# Patient Record
Sex: Female | Born: 1970 | Race: White | Hispanic: No | Marital: Married | Smoking: Former smoker
Health system: Southern US, Community
[De-identification: ages and names within clinical notes are randomized; demographics above are authoritative.]

## PROBLEM LIST (undated history)

## (undated) DIAGNOSIS — M797 Fibromyalgia: Secondary | ICD-10-CM

## (undated) DIAGNOSIS — F32A Depression, unspecified: Secondary | ICD-10-CM

## (undated) DIAGNOSIS — G8929 Other chronic pain: Secondary | ICD-10-CM

## (undated) DIAGNOSIS — F419 Anxiety disorder, unspecified: Secondary | ICD-10-CM

## (undated) DIAGNOSIS — M549 Dorsalgia, unspecified: Secondary | ICD-10-CM

## (undated) DIAGNOSIS — F329 Major depressive disorder, single episode, unspecified: Secondary | ICD-10-CM

## (undated) DIAGNOSIS — E785 Hyperlipidemia, unspecified: Secondary | ICD-10-CM

## (undated) HISTORY — PX: OTHER SURGICAL HISTORY: SHX169

## (undated) HISTORY — PX: CHOLECYSTECTOMY: SHX55

## (undated) HISTORY — PX: GASTROPLASTY DUODENAL SWITCH: SHX1699

## (undated) HISTORY — DX: Dorsalgia, unspecified: M54.9

## (undated) HISTORY — DX: Other chronic pain: G89.29

## (undated) HISTORY — DX: Major depressive disorder, single episode, unspecified: F32.9

## (undated) HISTORY — DX: Fibromyalgia: M79.7

## (undated) HISTORY — DX: Anxiety disorder, unspecified: F41.9

## (undated) HISTORY — DX: Depression, unspecified: F32.A

---

## 2006-08-03 ENCOUNTER — Encounter: Payer: Self-pay | Admitting: Unknown Physician Specialty

## 2006-08-22 ENCOUNTER — Encounter: Payer: Self-pay | Admitting: Unknown Physician Specialty

## 2007-05-13 ENCOUNTER — Ambulatory Visit: Payer: Self-pay | Admitting: Anesthesiology

## 2007-05-27 ENCOUNTER — Ambulatory Visit: Payer: Self-pay | Admitting: Anesthesiology

## 2007-06-16 ENCOUNTER — Ambulatory Visit: Payer: Self-pay | Admitting: Anesthesiology

## 2007-07-28 ENCOUNTER — Ambulatory Visit: Payer: Self-pay | Admitting: Anesthesiology

## 2010-09-03 ENCOUNTER — Ambulatory Visit: Payer: Self-pay | Admitting: Emergency Medicine

## 2010-09-22 HISTORY — PX: BREAST BIOPSY: SHX20

## 2011-07-07 ENCOUNTER — Ambulatory Visit: Payer: Self-pay | Admitting: General Surgery

## 2012-09-17 ENCOUNTER — Ambulatory Visit (HOSPITAL_COMMUNITY)
Admission: RE | Admit: 2012-09-17 | Discharge: 2012-09-17 | Disposition: A | Discharge status: Home / Self Care | Payer: 59 | Attending: Psychiatry | Admitting: Psychiatry

## 2012-09-17 ENCOUNTER — Encounter (HOSPITAL_COMMUNITY): Payer: Self-pay | Admitting: *Deleted

## 2012-09-17 DIAGNOSIS — F411 Generalized anxiety disorder: Secondary | ICD-10-CM | POA: Insufficient documentation

## 2012-09-17 DIAGNOSIS — F332 Major depressive disorder, recurrent severe without psychotic features: Secondary | ICD-10-CM | POA: Insufficient documentation

## 2012-09-17 HISTORY — DX: Hyperlipidemia, unspecified: E78.5

## 2012-09-17 NOTE — BH Assessment (Signed)
Assessment Note   Madison Morgan is an 41 y.o. female. Referred for an assessment for psych IOP. This service was recommended by her psychiatrist Dr. Imogene Burn, he stated she needed something more intense and frequent then he can currently provide. She saw him yesterday and he did make several changes to her medication regimen. She reports a long history of anxiety and panic going back to when she was [age]. As a result she states of this long history of anxiety with very little relief or treatment she is exhausted and tired of fighting and feels that these feelings have resulted in her being depressed and at times having suicidal thoughts. She has not had any previous attempts. She has not had any inpatient psychiatric admissions. As recently as three weeks ago she felt especially defeated and considered taking the six Klonopin she had in her possession before she went to bed hoping she would not wake up. She did not complete because she researched on the internet its toxicity and she found six would not kill her so she abandoned the plan. She denies being suicidal today.She is accompanied by her husband of 14 years and he is present for the assessment and supportive of her.She states she has anxiety and panic daily and it has worsened slightly since Aug when she started a new job.She becomes more anxious when she leaves the home and because of this she rarely does. She has a lot of anxiety around driving and going to the grocery store, the dread of it an anxiety attack happening is a lot of what will start an attack.States twenty years ago when her Mother left her Father he had a nervous breakdown, no other known family history.She is tearful and has a sad affect in interview. She is taking time off from work to address her mood and anxiety and to be able to attend IOP. She denies any substance abuse issues, denies any psychosis, and is not homicidal. She is seeking medication management and relief form such  frequent and intense anxiety and depression. She has a start date for psych IOP on Monday the 30th. She was directed downstairs today after completing the assessment to get her new person paper work to complete and bring back with her on Monday.She is currently taking Cymbalta and this was started two weeks ago. Her Klonopin regimen was changed from 1 mg at HS to one half a pill BID and her first day with this regimen was today. She states strongly that she is not bipolar but is prescribed Lithium and Seroquel. She states the medications are not having much effect. She has had a trial of Nortriptyline in the past ans she couldn't tolerate it she said it made her feel very angry. She has a start date for psych IOP on Monday the 30th.  Axis I: Anxiety Disorder NOS and Major Depression, Recurrent severe Axis II: No diagnosis Axis III:  Past Medical History  Diagnosis Date  . Hyperlipemia     currently taking lipitor   Axis IV: other psychosocial or environmental problems and problems related to social environment Axis V: 41-50 serious symptoms  Past Medical History:  Past Medical History  Diagnosis Date  . Hyperlipemia     currently taking lipitor    No past surgical history on file.  Family History: No family history on file.  Social History:  reports that she has been smoking.  She has never used smokeless tobacco. She reports that she does not drink alcohol or  use illicit drugs.  Additional Social History:  Alcohol / Drug Use Pain Medications: not abusing Prescriptions: not abusing Over the Counter: not abusing History of alcohol / drug use?: No history of alcohol / drug abuse  CIWA:   COWS:    Allergies: Allergies no known allergies  Home Medications:  (Not in a hospital admission)  OB/GYN Status:  No LMP recorded.  General Assessment Data Location of Assessment: [facility] Living Arrangements: Spouse/significant other Can pt return to current living  arrangement?: Yes Admission Status: Voluntary Is patient capable of signing voluntary admission?: Yes Transfer from: Home Referral Source: Psychiatrist  Education Status Is patient currently in school?: No  Risk to self Suicidal Ideation: No Suicidal Intent: No Is patient at risk for suicide?: No Suicidal Plan?: No Access to Means: No What has been your use of drugs/alcohol within the last 12 months?: none Previous Attempts/Gestures: No How many times?: 0  Other Self Harm Risks: none Triggers for Past Attempts:  (none) Intentional Self Injurious Behavior: None Family Suicide History: No Recent stressful life event(s):  (job change in Aug of 2013) Persecutory voices/beliefs?: No Depression: Yes Depression Symptoms: Despondent;Tearfulness;Isolating;Fatigue;Guilt;Loss of interest in usual pleasures;Feeling worthless/self pity Substance abuse history and/or treatment for substance abuse?: No Suicide prevention information given to non-admitted patients: Not applicable  Risk to Others Homicidal Ideation: No Thoughts of Harm to Others: No Current Homicidal Intent: No Current Homicidal Plan: No Access to Homicidal Means: No History of harm to others?: No Assessment of Violence: None Noted Does patient have access to weapons?: No Criminal Charges Pending?: No Does patient have a court date: No  Psychosis Hallucinations: None noted Delusions: None noted  Mental Status Report Appear/Hygiene:  (attractive , neat ) Eye Contact: Good Motor Activity: Freedom of movement;Unremarkable Speech: Logical/coherent Level of Consciousness: Alert Mood: Depressed Affect: Sad Anxiety Level: Moderate Thought Processes: Coherent;Relevant Judgement: Unimpaired Orientation: Person;Place;Time;Situation Obsessive Compulsive Thoughts/Behaviors: None  Cognitive Functioning Concentration: Normal Memory: Recent Intact;Remote Intact IQ: Average Insight: Good Impulse Control:  Good Appetite: Poor Weight Loss: 10  Weight Gain: 0  Sleep: No Change Total Hours of Sleep: 9  Vegetative Symptoms: None  ADLScreening [facility]) Patient's cognitive ability adequate to safely complete daily activities?: Yes Patient able to express need for assistance with ADLs?: Yes Independently performs ADLs?: Yes (appropriate for developmental age)  Abuse/Neglect [facility]) Physical Abuse: Denies Verbal Abuse: Denies Sexual Abuse: Denies  Prior Inpatient Therapy Prior Inpatient Therapy: No  Prior Outpatient Therapy Prior Outpatient Therapy: Yes Prior Therapy Dates: current Prior Therapy Facilty/Provider(s): Dr Imogene Burn Reason for Treatment: depression and anxiety  ADL Screening (condition at time of admission) Patient's cognitive ability adequate to safely complete daily activities?: Yes Patient able to express need for assistance with ADLs?: Yes Independently performs ADLs?: Yes (appropriate for developmental age) Weakness of Legs: None Weakness of Arms/Hands: None  Home Assistive Devices/Equipment Home Assistive Devices/Equipment: None    Abuse/Neglect Assessment (Assessment to be complete while patient is alone) Physical Abuse: Denies Verbal Abuse: Denies Sexual Abuse: Denies Exploitation of patient/patient's resources: Denies Self-Neglect: Denies Values / Beliefs Cultural Requests During Hospitalization: None Spiritual Requests During Hospitalization: None   Advance Directives (For Healthcare) Advance Directive: Patient would not like information Pre-existing out of facility DNR order (yellow form or pink MOST form): No Nutrition Screen- [facility] Adult/WL/AP Patient's home diet: Regular Have you recently lost weight without trying?: Yes If yes, how much weight have you lost?: 2-13 lb Have you been eating poorly because of a decreased appetite?:  Yes Malnutrition Screening Tool Score: 2   Additional Information 1:1 In Past 12 Months?: No CIRT Risk:  No Elopement Risk: No Does patient have medical clearance?: No     Disposition:  Disposition Disposition of Patient: Outpatient treatment Type of outpatient treatment: Psych Intensive Outpatient  On Site Evaluation by:   Reviewed with Physician:     Wynona Luna 09/17/2012 12:25 PM

## 2012-09-20 ENCOUNTER — Encounter (HOSPITAL_COMMUNITY): Payer: Self-pay

## 2012-09-20 ENCOUNTER — Other Ambulatory Visit (HOSPITAL_COMMUNITY): Payer: 59 | Attending: Psychiatry | Admitting: Psychiatry

## 2012-09-20 DIAGNOSIS — F329 Major depressive disorder, single episode, unspecified: Secondary | ICD-10-CM | POA: Insufficient documentation

## 2012-09-20 DIAGNOSIS — F411 Generalized anxiety disorder: Secondary | ICD-10-CM | POA: Insufficient documentation

## 2012-09-20 DIAGNOSIS — F332 Major depressive disorder, recurrent severe without psychotic features: Secondary | ICD-10-CM | POA: Insufficient documentation

## 2012-09-20 DIAGNOSIS — F32A Depression, unspecified: Secondary | ICD-10-CM | POA: Insufficient documentation

## 2012-09-20 DIAGNOSIS — F410 Panic disorder [episodic paroxysmal anxiety] without agoraphobia: Secondary | ICD-10-CM | POA: Insufficient documentation

## 2012-09-20 DIAGNOSIS — F331 Major depressive disorder, recurrent, moderate: Secondary | ICD-10-CM

## 2012-09-20 MED ORDER — DULOXETINE HCL 30 MG PO CPEP: 30.0000 mg | ORAL_CAPSULE | Freq: Two times a day (BID) | ORAL | Status: DC
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
## 2012-09-20 NOTE — Progress Notes (Signed)
    Daily Group Progress Note  Program: IOP  Group Time: 9:00-10:30 am   Participation Level: Active  Behavioral Response: Appropriate  Type of Therapy:  Process Group  Summary of Progress: Patient was new to the group today and observed the group process.      Group Time: 10:30 am - 12:00 pm   Participation Level:  Active  Behavioral Response: Appropriate  Type of Therapy: Psycho-education Group  Summary of Progress:  Patient participated in a goodbye ceremony to a member ending today and practiced the skill of healthy closure.  Carman Ching, LCSW

## 2012-09-21 ENCOUNTER — Other Ambulatory Visit (HOSPITAL_COMMUNITY): Payer: 59 | Admitting: Psychiatry

## 2012-09-21 DIAGNOSIS — F32A Depression, unspecified: Secondary | ICD-10-CM

## 2012-09-21 DIAGNOSIS — F329 Major depressive disorder, single episode, unspecified: Secondary | ICD-10-CM
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
## 2012-09-23 ENCOUNTER — Other Ambulatory Visit (HOSPITAL_COMMUNITY): Payer: 59 | Attending: Psychiatry | Admitting: Psychiatry

## 2012-09-23 DIAGNOSIS — F410 Panic disorder [episodic paroxysmal anxiety] without agoraphobia: Secondary | ICD-10-CM | POA: Insufficient documentation

## 2012-09-23 DIAGNOSIS — F32A Depression, unspecified: Secondary | ICD-10-CM

## 2012-09-23 DIAGNOSIS — F411 Generalized anxiety disorder: Secondary | ICD-10-CM | POA: Diagnosis not present

## 2012-09-23 DIAGNOSIS — F332 Major depressive disorder, recurrent severe without psychotic features: Secondary | ICD-10-CM | POA: Insufficient documentation

## 2012-09-23 DIAGNOSIS — F329 Major depressive disorder, single episode, unspecified: Secondary | ICD-10-CM

## 2012-09-23 NOTE — Progress Notes (Signed)
Patient ID: Madison Morgan, female   DOB: 06/24/1971, 42 y.o.   MRN: [id number] Patient seen today.  She is concerned about feeling tired and very sleepy in the morning.  She's taking Seroquel 200 mg at bedtime, lithium 450 mg twice a day, Cymbalta 30 mg twice a day and Klonopin 1 mg twice a day.  Seroquel and lithium was started by her psychiatrist recently.  She endorse feeling very sleepy soon after taking the Seroquel.  I recommend to take Seroquel 100 mg for first few days to avoid sedation and excessive tiredness and then gradually increase to 200 milligram.  Patient acknowledged.  I explained risks and benefits of medication.  Reassurance given.

## 2012-09-23 NOTE — Progress Notes (Signed)
    Daily Group Progress Note  Program: IOP  Group Time: 0900-1030  Participation Level: Minimal  Behavioral Response: Drowsy  Type of Therapy:  Process Group  Summary of Progress: patient states she's feeling very depressed today.  Concerned that her medication isn't working.  Requested to see the doctor.  Pt discussed how the conflict between her husband and oldest daughter is affecting her.     Group Time: 1045-1200  Participation Level:  Minimal  Behavioral Response: Motivated  Type of Therapy: Psycho-education Group Discussed sleep hygiene practices:   What it is, five stages of sleep, tips for a better sleep, and which tip he will implement.     Chestine Spore, RITA, CNA

## 2012-09-24 ENCOUNTER — Other Ambulatory Visit (HOSPITAL_COMMUNITY): Payer: 59 | Admitting: Psychiatry

## 2012-09-24 DIAGNOSIS — F329 Major depressive disorder, single episode, unspecified: Secondary | ICD-10-CM

## 2012-09-24 DIAGNOSIS — F32A Depression, unspecified: Secondary | ICD-10-CM

## 2012-09-24 DIAGNOSIS — F332 Major depressive disorder, recurrent severe without psychotic features: Secondary | ICD-10-CM | POA: Diagnosis not present

## 2012-09-24 NOTE — Progress Notes (Signed)
Patient ID: Madison Morgan, female   DOB: 1971-08-29, 42 y.o.   MRN: [id number] Today this patient was interviewed for 45 minutes. She was asked to be seen because she remains persistently depressed. Her mood is no different than it's been over the last few days. And a quick summary the patient has been feeling persistently depressed for the last 6 weeks which is something she's never experienced before. She's never been persistently depressed rather she's had weeks of being depressed. Recently her psychiatrist in [place] her back on the Cymbalta 60 mg a day, Eskalith 450 twice a day and recently her Seroquel dose was lower from 200 mg to 150 mg.the reason for the change was because the patient felt overly sedated and now feels a bit better. The main issues with this patient seem to be related to her work setting which recently denied for short term disability.This denial is probably more related to paperwork issues and she is now in the process of resolving these problems. He is also related to conflicts at home between her daughter and her husband. She claims much of this is just related to financial issues. Today the patient denies being acutely suicidal. She's never made an attempt in the past. She has a vague plan but has good reasons not to make an attempt to herself. She been on these medications only for approximately a week. Today the patient was educated that it takes 3-4 weeks to have any true affect. The patient denies any psychotic symptomatology. The patient's sleep pattern is that she's still sleeping excessively. It is noted that she said that she had a lithium blood level that was 0.8 recently.  Today's recommendations are for the patient that she continue taking all these medications as prescribed. She her self has discontinued her Klonopin. I do not believe she is acutely suicidal at this time. In addition to the recommendations of continuing on with the IOP program is to make contact with  the previous psychotherapist that she had seen a year ago in the [place] community. I recommendations was that she should go ahead and plan appointment in approximately one to 2 weeks when she's out of the IOP program. Another recommendation is to consider having a family session while she is in the IOP program. The patient agreed with these recommendations. At this time will make no changes in her medications. Once again she was described the pros and cons of her medications potential side effects and she agreed to continue these medicines.

## 2012-09-24 NOTE — Progress Notes (Signed)
    Daily Group Progress Note  Program: IOP  Group Time: 0900-1100  Participation Level: Active  Behavioral Response: Sharing  Type of Therapy:  Process Group  Summary of Progress: Patient voiced her frustration with her depression and the medication.  States she did make the medication adjustments that Dr. Lolly Mustache recommended yesterday.  Pt became very animated when another peer was talking about football.  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 Patient also practiced meditation and progressive relaxation in relation to the psycho-educational group yesterday.     Group Time: ----  Participation Level:  Active  Behavioral Response: Appropriate  Type of Therapy: Psycho-education Group  Summary of Progress: cc: above  Madison Morgan, RITA, CNA

## 2012-09-27 ENCOUNTER — Other Ambulatory Visit (HOSPITAL_COMMUNITY): Payer: 59 | Admitting: Psychiatry

## 2012-09-27 DIAGNOSIS — F332 Major depressive disorder, recurrent severe without psychotic features: Secondary | ICD-10-CM | POA: Diagnosis not present

## 2012-09-27 DIAGNOSIS — F32A Depression, unspecified: Secondary | ICD-10-CM

## 2012-09-27 DIAGNOSIS — F329 Major depressive disorder, single episode, unspecified: Secondary | ICD-10-CM

## 2012-09-27 NOTE — Progress Notes (Signed)
Patient ID: Madison Morgan, female   DOB: 16-Oct-1970, 42 y.o.   MRN: [id number] A:  Provided pt with Vocational Rehabilitation handout.  R:  Pt receptive.

## 2012-09-27 NOTE — Progress Notes (Signed)
    Daily Group Progress Note  Program: IOP  Group Time: 9:00-10:30 am   Participation Level: Active  Behavioral Response: Appropriate  Type of Therapy:  Process Group  Summary of Progress: Patient reports high anxiety today associated with financial difficulties from being off from her job and not being approved yet for short term disability. She talked about how she feels like a "burden" to her family for needing to ask her parents for money and for her husband paying bills she is responsible for. She states this stressors is preventing her from focusing on the main reasons for entering treatment. She also talked about how her job is a trigger for stress and depression and although she is looking for alternate employment, she has not been able to find anything.      Group Time: 10:30 am - 12:00 pm   Participation Level:  Active  Behavioral Response: Appropriate  Type of Therapy: Psycho-education Group  Summary of Progress: Patient participated in a group on Grief and Loss that was facilitated by Theda Belfast and identified current losses and affective ways of grieving.   Carman Ching, LCSW

## 2012-09-28 ENCOUNTER — Other Ambulatory Visit (HOSPITAL_COMMUNITY): Payer: 59 | Admitting: Psychiatry

## 2012-09-28 DIAGNOSIS — F32A Depression, unspecified: Secondary | ICD-10-CM

## 2012-09-28 DIAGNOSIS — F329 Major depressive disorder, single episode, unspecified: Secondary | ICD-10-CM

## 2012-09-28 DIAGNOSIS — F332 Major depressive disorder, recurrent severe without psychotic features: Secondary | ICD-10-CM | POA: Diagnosis not present

## 2012-09-28 NOTE — Progress Notes (Signed)
    Daily Group Progress Note  Program: IOP  Group Time: 9:00-10:30 am   Participation Level: Active  Behavioral Response: Appropriate  Type of Therapy:  Process Group  Summary of Progress: Patient reports high anxiety and depression associated with financial struggles and states she did not have the courage to ask her parents for money yesterday to help her pay bills that are due. She feels like a burden to her family. She states she feels slightly improved with her depression by having a place to come and talk with others and expressed fears about what she will do to manage her depression after the group is over. She said she feels accepted and safe here.      Group Time: 10:30 am - 12:00 pm   Participation Level:  Active  Behavioral Response: Appropriate  Type of Therapy: Psycho-education Group  Summary of Progress: Patient learned about the symptoms of depression and anxiety and how to recognize them to avoid or reduce future relapses.  Carman Ching, LCSW

## 2012-09-29 ENCOUNTER — Other Ambulatory Visit (HOSPITAL_COMMUNITY): Payer: 59 | Admitting: Psychiatry

## 2012-09-29 DIAGNOSIS — F329 Major depressive disorder, single episode, unspecified: Secondary | ICD-10-CM

## 2012-09-29 DIAGNOSIS — F332 Major depressive disorder, recurrent severe without psychotic features: Secondary | ICD-10-CM | POA: Diagnosis not present

## 2012-09-29 DIAGNOSIS — F32A Depression, unspecified: Secondary | ICD-10-CM

## 2012-09-29 NOTE — Progress Notes (Signed)
    Daily Group Progress Note  Program: IOP  Group Time: 9:00-10:30 am   Participation Level: Active  Behavioral Response: Appropriate  Type of Therapy:  Process Group  Summary of Progress: Patient reports no change in her depression and anxiety and if anything feels them worsening each day she puts off asking for financial help from her parents to help pay past due bills. Members challenged patient on how she is contributing to her depression by not addressing stressors in her life. Patient shared how she struggles to ask for or accept help from others and tries to make sure others needs are taken care of before hers. She is unsure if she wants to change this behavior but explored how it is contributing to her depression.       Group Time: 10:30 am - 12:00 pm   Participation Level:  Active  Behavioral Response: Appropriate  Type of Therapy: Psycho-education Group  Summary of Progress: Patient learned about mental health support groups for continuing support following ending the program.   Carman Ching, LCSW

## 2012-09-30 ENCOUNTER — Other Ambulatory Visit (HOSPITAL_COMMUNITY): Payer: 59 | Admitting: Psychiatry

## 2012-09-30 DIAGNOSIS — F332 Major depressive disorder, recurrent severe without psychotic features: Secondary | ICD-10-CM | POA: Diagnosis not present

## 2012-09-30 DIAGNOSIS — F32A Depression, unspecified: Secondary | ICD-10-CM

## 2012-09-30 DIAGNOSIS — F329 Major depressive disorder, single episode, unspecified: Secondary | ICD-10-CM

## 2012-09-30 NOTE — Progress Notes (Signed)
    Daily Group Progress Note  Program: IOP  Group Time: 9:00-10:30 am   Participation Level: Minimal  Behavioral Response: Appropriate  Type of Therapy:  Group Therapy  Summary of Progress: Patient reports high depression again today and appears to not be making much progress. She has so much guilt and shame about herself that she struggles to ask others for the help that she needs. She is working on improving her image of self and increasing her self-esteem that is feeding her depression.       Group Time: 10:30 am - 12:00 pm   Participation Level:  Active  Behavioral Response: Appropriate  Type of Therapy: Psycho-education Group  Summary of Progress: Patient participated in two goodbye ceremonies to members ending the group today and practiced the skill of having healthy closure.   Carman Ching, LCSW

## 2012-10-01 ENCOUNTER — Other Ambulatory Visit (HOSPITAL_COMMUNITY): Payer: 59 | Admitting: Psychiatry

## 2012-10-01 DIAGNOSIS — F329 Major depressive disorder, single episode, unspecified: Secondary | ICD-10-CM

## 2012-10-01 DIAGNOSIS — F32A Depression, unspecified: Secondary | ICD-10-CM

## 2012-10-01 DIAGNOSIS — F332 Major depressive disorder, recurrent severe without psychotic features: Secondary | ICD-10-CM | POA: Diagnosis not present
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
## 2012-10-04 ENCOUNTER — Other Ambulatory Visit (HOSPITAL_COMMUNITY): Payer: 59 | Admitting: Psychiatry

## 2012-10-04 DIAGNOSIS — F329 Major depressive disorder, single episode, unspecified: Secondary | ICD-10-CM

## 2012-10-04 DIAGNOSIS — F32A Depression, unspecified: Secondary | ICD-10-CM

## 2012-10-04 DIAGNOSIS — F332 Major depressive disorder, recurrent severe without psychotic features: Secondary | ICD-10-CM | POA: Diagnosis not present

## 2012-10-04 NOTE — Progress Notes (Signed)
    Daily Group Progress Note  Program: IOP  Group Time: 9:00-10:30 am   Participation Level: Minimal  Behavioral Response: Appropriate  Type of Therapy:  Process Group  Summary of Progress: Patient only talks when called upon, but reports a reduction in her depression symptoms and associated it with receiving support from her parents after asking for financial help last week. Patient is learning she has self-worth and is worth asking others for support. She is also using the skill of assertion of her needs.     Group Time: 10:30 am - 12:00 pm   Participation Level:  Active  Behavioral Response: Appropriate  Type of Therapy: Psycho-education Group  Summary of Progress: Patient participated in a group facilitated by Theda Belfast on Grief and Loss and learned healthy was to grieve losses.  Carman Ching, LCSW

## 2012-10-05 ENCOUNTER — Other Ambulatory Visit (HOSPITAL_COMMUNITY): Payer: 59 | Admitting: Psychiatry

## 2012-10-05 DIAGNOSIS — F332 Major depressive disorder, recurrent severe without psychotic features: Secondary | ICD-10-CM | POA: Diagnosis not present

## 2012-10-05 DIAGNOSIS — F32A Depression, unspecified: Secondary | ICD-10-CM

## 2012-10-05 DIAGNOSIS — F329 Major depressive disorder, single episode, unspecified: Secondary | ICD-10-CM

## 2012-10-05 NOTE — Progress Notes (Signed)
    Daily Group Progress Note  Program: IOP  Group Time: 9:00-10:30 am   Participation Level: Active  Behavioral Response: Appropriate  Type of Therapy:  Process Group  Summary of Progress: Patient was more talkative today than previous days. She described the negative self talk she engages in regularly that fuels her guilt and depression. She struggles with such low self-esteem and low self-worth and struggles to know how to challenge it.      Group Time: 10:30 am - 12:00 pm   Participation Level:  Active  Behavioral Response: Appropriate  Type of Therapy: Psycho-education Group  Summary of Progress: Patient learned about the CBT skill of reframing negative thoughts to reduce symptoms of depression and anxiety.   Carman Ching, LCSW

## 2012-10-06 ENCOUNTER — Other Ambulatory Visit (HOSPITAL_COMMUNITY): Payer: 59 | Admitting: Psychiatry

## 2012-10-06 DIAGNOSIS — F332 Major depressive disorder, recurrent severe without psychotic features: Secondary | ICD-10-CM | POA: Diagnosis not present

## 2012-10-06 DIAGNOSIS — F32A Depression, unspecified: Secondary | ICD-10-CM

## 2012-10-06 DIAGNOSIS — F329 Major depressive disorder, single episode, unspecified: Secondary | ICD-10-CM

## 2012-10-06 NOTE — Progress Notes (Signed)
    Daily Group Progress Note  Program: IOP  Group Time: 9:00-10:30 am   Participation Level: Active  Behavioral Response: Appropriate  Type of Therapy:  Process Group  Summary of Progress: Patient talked a good amount of time today and appears comfortable in the group setting. She has made great progress with feeling more confident in herself among the group members. She processed a conflict she is having where she feels torn between her husband and daughter and received feedback to try from the group members. She is identifying how she struggles to express her needs to others and stand up for herself and is realizing this is impacting her emotional wellness.      Group Time: 10:30 am - 12:00 pm   Participation Level:  Active  Behavioral Response: Appropriate  Type of Therapy: Psycho-education Group  Summary of Progress: Patient participated in a skills group on healthy boundary setting and identified personality types that make it difficult to set limits with others and also explored the five boundary areas to explore that may need limit setting to increase emotional wellness.   Carman Ching, LCSW

## 2012-10-07 ENCOUNTER — Other Ambulatory Visit (HOSPITAL_COMMUNITY): Payer: 59 | Admitting: Psychiatry

## 2012-10-07 DIAGNOSIS — F332 Major depressive disorder, recurrent severe without psychotic features: Secondary | ICD-10-CM | POA: Diagnosis not present

## 2012-10-07 DIAGNOSIS — F339 Major depressive disorder, recurrent, unspecified: Secondary | ICD-10-CM

## 2012-10-08 ENCOUNTER — Other Ambulatory Visit (INDEPENDENT_AMBULATORY_CARE_PROVIDER_SITE_OTHER): Payer: 59 | Admitting: Psychiatry

## 2012-10-08 ENCOUNTER — Encounter (HOSPITAL_COMMUNITY): Payer: Self-pay

## 2012-10-08 DIAGNOSIS — F339 Major depressive disorder, recurrent, unspecified: Secondary | ICD-10-CM

## 2012-10-08 DIAGNOSIS — F332 Major depressive disorder, recurrent severe without psychotic features: Secondary | ICD-10-CM | POA: Diagnosis not present

## 2012-10-08 NOTE — Progress Notes (Signed)
Patient ID: Madison Morgan, female   DOB: 1970/10/27, 42 y.o.   MRN: [id number] D:  This is a 66 married caucasian female, who was referred per Dr. Imogene Burn, treatment for depressive and anxiety symptoms. Admits to SI, but is able to contract for safety. Denies any A/V hallucinations. Trigger: 1) Job ([employer]) of four months. States it is a hostile working environment. Also mentioned that there isn't enough work to do there. 2) Conflict between husband and older daughter. States that they argue all the time. 3) Financial Strain 4) Fibromyalgia and chronic back pain  Pt completed MH-IOP today.  Reports feeling better i.e. (Decreased sadness, not as irritable.)  Continues to struggle with making decisions, poor appetite, and isolation.  Denies any SI/HI or A/V hallucinations.  States groups were helpful.  Would like to continue working on maintaining structure.  A:  D/C today.  F/U with Dr. Imogene Burn.  Pt will discuss the possibility of having individual counseling with Dr. Imogene Burn. Dr. Imogene Burn will determine pt's rtw date.  R:  Pt receptive.

## 2012-10-08 NOTE — Patient Instructions (Signed)
Patient completed MH-IOP today.  Will follow up with Dr. Imogene Burn.  Encouraged support groups.

## 2012-10-08 NOTE — Progress Notes (Signed)
    Daily Group Progress Note  Program: IOP  Group Time: 9:00-10:30 am   Participation Level: Active  Behavioral Response: Appropriate  Type of Therapy:  Process Group  Summary of Progress: Patient presented isolated but became engaged in the group with time. She continues to struggle with her husband not understanding her diagnosis. She states that her husband is trying to understand and being patient but still makes hurtful comments at times. She also told the group about spending the afternoon with her daughter yesterday. Patient is becoming more assertive and meeting her needs. She continues to work on self-care and assertiveness.      Group Time: 10:30 am - 12:00 pm   Participation Level:  Active  Behavioral Response: Appropriate  Type of Therapy: Psycho-education Group  Summary of Progress: Patient learned a stress management tool (heartmath) and how to use it to reduce stress and maintain wellness.   Carman Ching, LCSW

## 2012-10-08 NOTE — Progress Notes (Signed)
[facility] MD Progress Note  10/08/2012 10:27 AM Madison Morgan  MRN:  [id number] Subjective:  discharge Diagnosis:  Axis I: Major Depression, Recurrent severe This patient has been at this setting for now approximately 14 weeks. Issues include conflicts with money that affected her relationships with her husband and her daughter. It should be noted that since she's been at this setting her husband has not been as upset with her. It also should be noted that one issue around money was getting her short-term disability which she did achieve during the stay here. Initially she described itself is sleeping too much but now it doesn't seem to be apparent. Overall she is better than when she came in probably in that she feels less depressed. She does have a followup appointment with Dr. Claudie Fisherman in [place] and at that time we'll discuss getting a psychotherapist which I think is very important. At no time was this patient suicidal. She denies being sedated. It should be noted with a dose of Eskalith 450 twice a day she obtains a lithium blood level 0.8. She is showing no side effects from any of her psychotropic medications. She understands the pros and cons and agrees to continue taking his medicines and to keep her appointment with Dr. Johny Drilling. Overall she did benefit from being in the IOP program ADL's:  Intact  Sleep: Good  Appetite:  Good  Suicidal Ideation:  no Homicidal Ideation:  none AEB (as evidenced by):  Psychiatric Specialty Exam: ROS  There were no vitals taken for this visit.There is no height or weight on file to calculate BMI.  General Appearance: Casual  Eye Contact::  Good  Speech:  Clear and Coherent  Volume:  Normal  Mood:  Euthymic  Affect:  Congruent  Thought Process:  Coherent  Orientation:  Full (Time, Place, and Person)  Thought Content:  WDL  Suicidal Thoughts:  No  Homicidal Thoughts:  No  Memory:  NA  Judgement:  Good  Insight:  Good  Psychomotor Activity:  Normal    Concentration:  Good  Recall:  Good  Akathisia:  No  Handed:  Right  AIMS (if indicated):     Assets:  Desire for Improvement  Sleep:      Current Medications: Current Outpatient Prescriptions  Medication Sig Dispense Refill  . atorvastatin (LIPITOR) 40 MG tablet Take 40 mg by mouth daily.      . clonazePAM (KLONOPIN) 1 MG tablet Take 1 mg by mouth 2 (two) times daily as needed.      . DULoxetine (CYMBALTA) 30 MG capsule Take 1 capsule (30 mg total) by mouth 2 (two) times daily.  60 capsule  0  . HYDROcodone-acetaminophen (LORTAB) 10-500 MG per tablet Take 1 tablet by mouth every 6 (six) hours as needed.      . lithium carbonate (ESKALITH) 450 MG CR tablet Take 450 mg by mouth 2 (two) times daily.      . QUEtiapine (SEROQUEL) 200 MG tablet Take 200 mg by mouth at bedtime.        Lab Results: No results found for this or any previous visit (from the past 48 hour(s)).  Physical Findings: AIMS:  , ,  ,  ,    CIWA:    COWS:     Treatment Plan Summary:  This patient will continue taking Cymbalta 60 mg in the morning,Eskalith 450 twice a day and Seroquel 150 mg at night. The patient will get a psychotherapist double for see Dr. Claudie Fisherman in [place]  in the next 2 weeks. This patient is much more stable not suicidal and is functioning well. She seems to have more pulls and skills to deal with her family issues. Financial issues are also lessened. Plan:  Medical Decision Making Problem Points:  Established problem, stable/improving (1) Data Points:  Review of new medications or change in dosage (2)  I certify that inpatient services furnished can reasonably be expected to improve the patient's condition.   Stevan Eberwein IRVING 10/08/2012, 10:27 AM

## 2012-10-08 NOTE — Progress Notes (Signed)
    Daily Group Progress Note  Program: IOP  Group Time: 9:00-10:30 am   Participation Level: Active  Behavioral Response: Appropriate  Type of Therapy:  Process Group  Summary of Progress:  Patient participated in a goodbye ceremony for two members ending the group today and expressed feelings associated with them leaving the group and expressed how they had both impacted patient during their time in the program.       Group Time: 10:30 am - 12:00 pm   Participation Level:  Active  Behavioral Response: Appropriate  Type of Therapy: Psycho-education Group  Summary of Progress:  Patient learned the skill of healthy boundary setting and how to set healthy limits with others to ensure their own personal wellness.   Abbrielle Batts E, LCSW 

## 2014-01-31 DIAGNOSIS — E785 Hyperlipidemia, unspecified: Secondary | ICD-10-CM | POA: Insufficient documentation

## 2014-01-31 DIAGNOSIS — J449 Chronic obstructive pulmonary disease, unspecified: Secondary | ICD-10-CM | POA: Insufficient documentation

## 2014-01-31 DIAGNOSIS — R5383 Other fatigue: Secondary | ICD-10-CM | POA: Insufficient documentation

## 2014-01-31 DIAGNOSIS — B2790 Infectious mononucleosis, unspecified without complication: Secondary | ICD-10-CM | POA: Insufficient documentation

## 2014-03-27 ENCOUNTER — Ambulatory Visit: Payer: Self-pay | Admitting: Internal Medicine

## 2014-12-26 ENCOUNTER — Ambulatory Visit
Admit: 2014-12-26 | Disposition: A | Discharge status: Home / Self Care | Payer: Self-pay | Attending: Internal Medicine | Admitting: Internal Medicine

## 2015-06-27 ENCOUNTER — Other Ambulatory Visit: Payer: Self-pay | Admitting: Internal Medicine

## 2015-06-27 DIAGNOSIS — Z1231 Encounter for screening mammogram for malignant neoplasm of breast: Secondary | ICD-10-CM

## 2015-06-28 ENCOUNTER — Ambulatory Visit
Admission: RE | Admit: 2015-06-28 | Discharge: 2015-06-28 | Disposition: A | Discharge status: Home / Self Care | Payer: BC Managed Care – PPO | Source: Ambulatory Visit | Attending: Internal Medicine | Admitting: Internal Medicine

## 2015-06-28 DIAGNOSIS — Z1231 Encounter for screening mammogram for malignant neoplasm of breast: Secondary | ICD-10-CM | POA: Insufficient documentation

## 2016-05-05 DIAGNOSIS — E669 Obesity, unspecified: Secondary | ICD-10-CM | POA: Insufficient documentation

## 2016-07-14 ENCOUNTER — Other Ambulatory Visit: Payer: Self-pay | Admitting: Internal Medicine

## 2016-07-14 DIAGNOSIS — Z1231 Encounter for screening mammogram for malignant neoplasm of breast: Secondary | ICD-10-CM

## 2016-07-22 ENCOUNTER — Encounter (INDEPENDENT_AMBULATORY_CARE_PROVIDER_SITE_OTHER): Payer: Self-pay

## 2016-07-22 ENCOUNTER — Ambulatory Visit
Admission: RE | Admit: 2016-07-22 | Discharge: 2016-07-22 | Disposition: A | Discharge status: Home / Self Care | Payer: BC Managed Care – PPO | Source: Ambulatory Visit | Attending: Internal Medicine | Admitting: Internal Medicine

## 2016-07-22 DIAGNOSIS — Z1231 Encounter for screening mammogram for malignant neoplasm of breast: Secondary | ICD-10-CM

## 2017-06-26 ENCOUNTER — Other Ambulatory Visit: Payer: Self-pay | Admitting: Internal Medicine

## 2017-06-26 DIAGNOSIS — Z1231 Encounter for screening mammogram for malignant neoplasm of breast: Secondary | ICD-10-CM

## 2017-07-24 ENCOUNTER — Ambulatory Visit
Admission: RE | Admit: 2017-07-24 | Discharge: 2017-07-24 | Disposition: A | Discharge status: Home / Self Care | Payer: BC Managed Care – PPO | Source: Ambulatory Visit | Attending: Internal Medicine | Admitting: Internal Medicine

## 2017-07-24 DIAGNOSIS — Z1231 Encounter for screening mammogram for malignant neoplasm of breast: Secondary | ICD-10-CM

## 2018-04-16 ENCOUNTER — Other Ambulatory Visit: Payer: Self-pay

## 2018-04-16 ENCOUNTER — Emergency Department
Admission: EM | Admit: 2018-04-16 | Discharge: 2018-04-16 | Disposition: A | Discharge status: Home / Self Care | Payer: BC Managed Care – PPO | Attending: Emergency Medicine | Admitting: Emergency Medicine

## 2018-04-16 ENCOUNTER — Encounter: Payer: Self-pay | Admitting: Emergency Medicine

## 2018-04-16 DIAGNOSIS — Y999 Unspecified external cause status: Secondary | ICD-10-CM | POA: Insufficient documentation

## 2018-04-16 DIAGNOSIS — Z79899 Other long term (current) drug therapy: Secondary | ICD-10-CM | POA: Diagnosis not present

## 2018-04-16 DIAGNOSIS — Y929 Unspecified place or not applicable: Secondary | ICD-10-CM | POA: Insufficient documentation

## 2018-04-16 DIAGNOSIS — S0003XA Contusion of scalp, initial encounter: Secondary | ICD-10-CM | POA: Insufficient documentation

## 2018-04-16 DIAGNOSIS — F17210 Nicotine dependence, cigarettes, uncomplicated: Secondary | ICD-10-CM | POA: Diagnosis not present

## 2018-04-16 DIAGNOSIS — S161XXA Strain of muscle, fascia and tendon at neck level, initial encounter: Secondary | ICD-10-CM

## 2018-04-16 DIAGNOSIS — Y939 Activity, unspecified: Secondary | ICD-10-CM | POA: Insufficient documentation

## 2018-04-16 DIAGNOSIS — S8001XA Contusion of right knee, initial encounter: Secondary | ICD-10-CM | POA: Diagnosis not present

## 2018-04-16 DIAGNOSIS — W108XXA Fall (on) (from) other stairs and steps, initial encounter: Secondary | ICD-10-CM | POA: Insufficient documentation

## 2018-04-16 DIAGNOSIS — W19XXXA Unspecified fall, initial encounter: Secondary | ICD-10-CM

## 2018-04-16 DIAGNOSIS — R51 Headache: Secondary | ICD-10-CM | POA: Diagnosis present

## 2018-04-16 DIAGNOSIS — S20212A Contusion of left front wall of thorax, initial encounter: Secondary | ICD-10-CM

## 2018-04-16 MED ORDER — METHOCARBAMOL 500 MG PO TABS: 500.0000 mg | ORAL_TABLET | Freq: Four times a day (QID) | ORAL | 0 refills | Status: DC

## 2018-04-16 MED ORDER — MELOXICAM 15 MG PO TABS: 15.0000 mg | ORAL_TABLET | Freq: Every day | ORAL | 0 refills | Status: DC

## 2018-04-16 NOTE — ED Triage Notes (Signed)
Pt states she was at work today, lost her footing and fell down about 6 steps, states she hit her head, left knee, and having pain under her left arm in the rib area. Denies blood thinners, denies LOC, appears in NAD. C/o head pain right after fall, only c/o a little head "pressure" now.

## 2018-04-16 NOTE — ED Provider Notes (Signed)
[facility] Emergency Department Provider Note  ____________________________________________  Time seen: Approximately 3:18 PM  I have reviewed the triage vital signs and the nursing notes.   HISTORY  Chief Complaint Fall; Knee Pain; and Headache    HPI Ronnell FreshwaterChandra S Quiocho is a 47 y.o. female who presents the emergency department status post a fall down a flight of stairs.  Patient reports that she accidentally missed a step at the top of the stairs, falling the rest of the flight of stairs.  Patient did hit her head but did not lose consciousness.  She endorses headache, neck pain, left rib pain, right knee pain at the time of occurrence.  She reports that the headache has improved at this point.  She reports that her neck pain is mildly stiff but it has full range of motion.  No radicular symptoms in the upper or lower extremities.  Patient reports tender left-sided ribs but denies any coughing or shortness of breath.  Patient endorses right knee pain but is able to bear weight and looks and extend the knee appropriately.  No medications for this complaint prior to arrival.  No other complaints at this time.    Past Medical History:  Diagnosis Date  . Anxiety   . Chronic back pain   . Depression   . Fibromyalgia   . Hyperlipemia    currently taking lipitor    Patient Active Problem List   Diagnosis Date Noted  . Major depression, recurrent, chronic (HCC) 10/08/2012  . Depression 09/20/2012    Past Surgical History:  Procedure Laterality Date  . BREAST BIOPSY Right 2012   negative, stereotactic biopsy    Prior to Admission medications   Medication Sig Start Date End Date Taking? Authorizing Provider  atorvastatin (LIPITOR) 40 MG tablet Take 40 mg by mouth daily.    [provider]  clonazePAM (KLONOPIN) 1 MG tablet Take 1 mg by mouth 2 (two) times daily as needed.    [provider]  DULoxetine (CYMBALTA) 30 MG capsule Take 1 capsule  (30 mg total) by mouth 2 (two) times daily. 09/20/12   Gayland Curryadepalli, Gayathri D, MD  HYDROcodone-acetaminophen (LORTAB) 10-500 MG per tablet Take 1 tablet by mouth every 6 (six) hours as needed.    [provider]  lithium carbonate (ESKALITH) 450 MG CR tablet Take 450 mg by mouth 2 (two) times daily.    [provider]  meloxicam (MOBIC) 15 MG tablet Take 1 tablet (15 mg total) by mouth daily. 04/16/18   Willeen Novak, Delorise RoyalsJonathan D, PA-C  methocarbamol (ROBAXIN) 500 MG tablet Take 1 tablet (500 mg total) by mouth 4 (four) times daily. 04/16/18   Geonna Lockyer, Delorise RoyalsJonathan D, PA-C  QUEtiapine (SEROQUEL) 200 MG tablet Take 200 mg by mouth at bedtime.    [provider]    Allergies Patient has no known allergies.  Family History  Problem Relation Age of Onset  . Depression Father   . Breast cancer Neg Hx     Social History Social History   Tobacco Use  . Smoking status: Current Every Day Smoker    Packs/day: 1.00    Types: Cigarettes  . Smokeless tobacco: Never Used  Substance Use Topics  . Alcohol use: No  . Drug use: No     Review of Systems  Constitutional: No fever/chills Eyes: No visual changes.  Cardiovascular: no chest pain. Respiratory: no cough. No SOB. Gastrointestinal: No abdominal pain.  No nausea, no vomiting.   Musculoskeletal: Positive for  neck pain, left rib pain, right knee pain Skin: Negative for rash, abrasions, lacerations, ecchymosis. Neurological: Positive for headache but denies focal weakness or numbness. 10-point ROS otherwise negative.  ____________________________________________   PHYSICAL EXAM:  VITAL SIGNS: ED Triage Vitals  Enc Vitals Group     BP 04/16/18 1505 (!) 144/91     Pulse Rate 04/16/18 1505 96     Resp 04/16/18 1505 18     Temp 04/16/18 1505 98.3 F (36.8 C)     Temp Source 04/16/18 1505 Oral     SpO2 04/16/18 1505 99 %     Weight 04/16/18 1506 205 lb (93 kg)     Height 04/16/18 1506 5\' 6"  (1.676 m)     Head  Circumference --      Peak Flow --      Pain Score 04/16/18 1506 6     Pain Loc --      Pain Edu? --      Excl. in GC? --      Constitutional: Alert and oriented. Well appearing and in no acute distress. Eyes: Conjunctivae are normal. PERRL. EOMI. Head: Atraumatic.  No visible hematoma, abrasions, lacerations.  Patient is mildly tender to palpation in the left parietal region with no palpable abnormality, crepitus.  No battle signs, raccoon eyes, serosanguineous fluid drainage from the ears or nares. ENT:      Ears:       Nose: No congestion/rhinnorhea.      Mouth/Throat: Mucous membranes are moist.  Neck: No stridor.  No midline cervical spine tenderness to palpation.  Radial pulse intact bilateral upper extremities.  Sensation intact and equal in all dermatomal distributions bilateral upper extremities.  Cardiovascular: Normal rate, regular rhythm. Normal S1 and S2.  Good peripheral circulation. Respiratory: Normal respiratory effort without tachypnea or retractions. Lungs CTAB. Good air entry to the bases with no decreased or absent breath sounds. Gastrointestinal: Bowel sounds 4 quadrants. Soft and nontender to palpation. No guarding or rigidity. No palpable masses. No distention. Musculoskeletal: Full range of motion to all extremities. No gross deformities appreciated.  Visualization of the left ribs reveals no deformity, abrasions or lacerations.  Equal chest rise and fall.  No paradoxical chest wall movement.  Patient is mildly tender to palpation diffusely to the left lateral ribs with no specific point tenderness.  No crepitus or palpable abnormality.  Good underlying breath sounds bilaterally.  Visualization of the right knee reveals no edema, ecchymosis, abrasions or lacerations.  Patient is able to extend and flex knee appropriately.  Varus, valgus, Lachman's, McMurray's is negative.  Patient is mildly tender to palpation of the tibial plateau but no other tenderness to palpation.   Dorsalis pedis pulse intact bilateral lower extremities.  Sensation intact and equal bilateral lower extremities. Neurologic:  Normal speech and language. No gross focal neurologic deficits are appreciated.  Cranial nerves II through XII grossly intact. Skin:  Skin is warm, dry and intact. No rash noted. Psychiatric: Mood and affect are normal. Speech and behavior are normal. Patient exhibits appropriate insight and judgement.   ____________________________________________   LABS (all labs ordered are listed, but only abnormal results are displayed)  Labs Reviewed - No data to display ____________________________________________  EKG   ____________________________________________  RADIOLOGY   No results found.  ____________________________________________    PROCEDURES  Procedure(s) performed:    Procedures    Medications - No data to display   ____________________________________________   INITIAL IMPRESSION / ASSESSMENT AND PLAN / ED COURSE  Pertinent labs & imaging results that were available during my care of the patient were reviewed by me and considered in my medical decision making (see chart for details).  Review of the [place] CSRS was performed in accordance of the [facility] prior to dispensing any controlled drugs.      Patient's diagnosis is consistent with fall, scalp contusion, cervical strain, rib contusion, knee contusion.  Patient presents the emergency department after missing a step and falling down 6 stairs.  Patient had multiple complaints but states that they are all mild.  I discussed with the patient imaging to include CT scan of the head and neck, chest x-ray, knee x-ray.  Patient declines at this time given reassuring exam.  Patient is advised that she may return at any time for imaging she should she desire.  She verbalizes understanding of same.. Patient will be discharged home with prescriptions for meloxicam and Robaxin. Patient is to follow up  with [facility] as needed or otherwise directed. Patient is given ED precautions to return to the ED for any worsening or new symptoms.     ____________________________________________  FINAL CLINICAL IMPRESSION(S) / ED DIAGNOSES  Final diagnoses:  Fall, initial encounter  Contusion of scalp, initial encounter  Strain of neck muscle, initial encounter  Contusion of rib on left side, initial encounter  Contusion of right knee, initial encounter      NEW MEDICATIONS STARTED DURING THIS VISIT:  ED Discharge Orders        Ordered    meloxicam (MOBIC) 15 MG tablet  Daily     04/16/18 1542    methocarbamol (ROBAXIN) 500 MG tablet  4 times daily     04/16/18 1542          This chart was dictated using voice recognition software/Dragon. Despite best efforts to proofread, errors can occur which can change the meaning. Any change was purely unintentional.    Racheal Patches, PA-C 04/16/18 1543    Jeanmarie Plant, MD 04/16/18 2329

## 2018-07-01 DIAGNOSIS — G894 Chronic pain syndrome: Secondary | ICD-10-CM | POA: Insufficient documentation

## 2018-07-02 ENCOUNTER — Other Ambulatory Visit: Payer: Self-pay | Admitting: Internal Medicine

## 2018-07-02 DIAGNOSIS — S0990XS Unspecified injury of head, sequela: Secondary | ICD-10-CM

## 2018-07-07 ENCOUNTER — Ambulatory Visit
Admission: RE | Admit: 2018-07-07 | Discharge: 2018-07-07 | Disposition: A | Discharge status: Home / Self Care | Payer: BC Managed Care – PPO | Source: Ambulatory Visit | Attending: Internal Medicine | Admitting: Internal Medicine

## 2018-07-07 DIAGNOSIS — S0990XS Unspecified injury of head, sequela: Secondary | ICD-10-CM | POA: Insufficient documentation

## 2018-07-07 DIAGNOSIS — X58XXXS Exposure to other specified factors, sequela: Secondary | ICD-10-CM | POA: Diagnosis not present

## 2018-08-16 ENCOUNTER — Other Ambulatory Visit: Payer: Self-pay | Admitting: Internal Medicine

## 2018-08-16 DIAGNOSIS — Z1231 Encounter for screening mammogram for malignant neoplasm of breast: Secondary | ICD-10-CM

## 2018-09-08 ENCOUNTER — Ambulatory Visit
Admission: RE | Admit: 2018-09-08 | Discharge: 2018-09-08 | Disposition: A | Discharge status: Home / Self Care | Payer: BC Managed Care – PPO | Source: Ambulatory Visit | Attending: Internal Medicine | Admitting: Internal Medicine

## 2018-09-08 DIAGNOSIS — Z1231 Encounter for screening mammogram for malignant neoplasm of breast: Secondary | ICD-10-CM | POA: Diagnosis not present

## 2018-12-01 ENCOUNTER — Other Ambulatory Visit: Payer: Self-pay

## 2018-12-01 ENCOUNTER — Encounter: Payer: Self-pay | Admitting: Psychiatry

## 2018-12-01 ENCOUNTER — Ambulatory Visit: Payer: BC Managed Care – PPO | Admitting: Psychiatry

## 2018-12-01 VITALS — BP 138/80 | HR 87 | Temp 99.4°F | Wt 221.2 lb

## 2018-12-01 DIAGNOSIS — F411 Generalized anxiety disorder: Secondary | ICD-10-CM

## 2018-12-01 DIAGNOSIS — F5105 Insomnia due to other mental disorder: Secondary | ICD-10-CM

## 2018-12-01 DIAGNOSIS — N939 Abnormal uterine and vaginal bleeding, unspecified: Secondary | ICD-10-CM | POA: Insufficient documentation

## 2018-12-01 DIAGNOSIS — F17200 Nicotine dependence, unspecified, uncomplicated: Secondary | ICD-10-CM

## 2018-12-01 DIAGNOSIS — F331 Major depressive disorder, recurrent, moderate: Secondary | ICD-10-CM | POA: Diagnosis not present

## 2018-12-01 DIAGNOSIS — F410 Panic disorder [episodic paroxysmal anxiety] without agoraphobia: Secondary | ICD-10-CM | POA: Diagnosis not present

## 2018-12-01 MED ORDER — FLUOXETINE HCL 20 MG PO CAPS: 40.0000 mg | ORAL_CAPSULE | Freq: Every day | ORAL | 0 refills | Status: DC
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
## 2018-12-20 ENCOUNTER — Encounter: Payer: Self-pay | Admitting: Psychiatry

## 2018-12-20 ENCOUNTER — Ambulatory Visit (INDEPENDENT_AMBULATORY_CARE_PROVIDER_SITE_OTHER): Payer: BC Managed Care – PPO | Admitting: Psychiatry

## 2018-12-20 ENCOUNTER — Other Ambulatory Visit: Payer: Self-pay

## 2018-12-20 DIAGNOSIS — F410 Panic disorder [episodic paroxysmal anxiety] without agoraphobia: Secondary | ICD-10-CM | POA: Diagnosis not present

## 2018-12-20 DIAGNOSIS — F5105 Insomnia due to other mental disorder: Secondary | ICD-10-CM | POA: Diagnosis not present

## 2018-12-20 DIAGNOSIS — F331 Major depressive disorder, recurrent, moderate: Secondary | ICD-10-CM

## 2018-12-20 DIAGNOSIS — F411 Generalized anxiety disorder: Secondary | ICD-10-CM | POA: Diagnosis not present

## 2018-12-20 DIAGNOSIS — F17200 Nicotine dependence, unspecified, uncomplicated: Secondary | ICD-10-CM

## 2018-12-20 MED ORDER — FLUOXETINE HCL 20 MG PO CAPS: 60.0000 mg | ORAL_CAPSULE | Freq: Every day | ORAL | 1 refills | Status: DC

## 2018-12-20 MED ORDER — MIRTAZAPINE 7.5 MG PO TABS: 7.5000 mg | ORAL_TABLET | Freq: Every day | ORAL | 1 refills | Status: DC

## 2018-12-20 NOTE — Progress Notes (Signed)
Virtual Visit via Telephone Note  I connected with Madison Morgan on 12/20/18 at 10:30 AM EDT by telephone and verified that I am speaking with the correct person using two identifiers.   I discussed the limitations, risks, security and privacy concerns of performing an evaluation and management service by telephone and the availability of in person appointments. I also discussed with the patient that there may be a patient responsible charge related to this service. The patient expressed understanding and agreed to proceed.    I discussed the assessment and treatment plan with the patient. The patient was provided an opportunity to ask questions and all were answered. The patient agreed with the plan and demonstrated an understanding of the instructions.   The patient was advised to call back or seek an in-person evaluation if the symptoms worsen or if the condition fails to improve as anticipated.  I provided 15  minutes of non-face-to-face time during this encounter.   Jomarie Longs, MD  BH MD OP Progress Note  12/20/2018 2:01 PM Sylena Rasul [facility]  MRN:  [id number]  Chief Complaint:  Chief Complaint    Follow-up     HPI: Madison Morgan is a 48 yr old CF, currently on FMLA , lives in [place] , has a history of MDD, GAD , Panic attacks , Insomnia, Tobacco use disorder , was evaluated by phone today.  Patient continues to struggle with depression, lack of motivation.  Patient also continues to struggle with sleep, sleeps too much some days or  is restless.  She has been struggling with appetite on and off , does not have any weight loss at this time.  She is not at work and hence does not have a routine now with regards to her sleep and meals. Discussed to make some changes with her life style, setting up a good bedtime and a wake time and so on.  She reports she is tolerating Prozac well, however has not noticed much benefits yet.  She also reports she is interested in a sleep aid.  She tried trazodone previously , but did not tolerate it.  Pt denies SI/HI/AH/VH.    Visit Diagnosis:    ICD-10-CM   1. MDD (major depressive disorder), recurrent episode, moderate (HCC) F33.1 FLUoxetine (PROZAC) 20 MG capsule  2. GAD (generalized anxiety disorder) F41.1 FLUoxetine (PROZAC) 20 MG capsule  3. Panic attacks F41.0 FLUoxetine (PROZAC) 20 MG capsule  4. Insomnia due to mental condition F51.05 mirtazapine (REMERON) 7.5 MG tablet  5. Tobacco use disorder F17.200     Past Psychiatric History: I have reviewed past psychiatric history from my progress note on 12/01/2018.   Past Medical History:  Past Medical History:  Diagnosis Date  . Anxiety   . Chronic back pain   . Depression   . Fibromyalgia   . Hyperlipemia    currently taking lipitor    Past Surgical History:  Procedure Laterality Date  . BREAST BIOPSY Right 2012   negative, stereotactic biopsy  . foot sugery Bilateral     Family Psychiatric History: I have reviewed  family history from my progress note on 12/01/2018  Family History:  Family History  Problem Relation Age of Onset  . Depression Father   . Breast cancer Neg Hx     Social History: Reviewed social history from my progress note on 12/01/2018 Social History   Socioeconomic History  . Marital status: Married    Spouse name: Corporate treasurer  . Number of children: 2  . Years  of education: Not on file  . Highest education level: High school graduate  Occupational History    Comment: full time  Social Needs  . Financial resource strain: Not hard at all  . Food insecurity:    Worry: Never true    Inability: Never true  . Transportation needs:    Medical: No    Non-medical: No  Tobacco Use  . Smoking status: Current Every Day Smoker    Packs/day: 1.00    Types: Cigarettes  . Smokeless tobacco: Never Used  Substance and Sexual Activity  . Alcohol use: No  . Drug use: No  . Sexual activity: Yes  Lifestyle  . Physical activity:    Days  per week: 0 days    Minutes per session: 0 min  . Stress: Very much  Relationships  . Social connections:    Talks on phone: Not on file    Gets together: Not on file    Attends religious service: Never    Active member of club or organization: No    Attends meetings of clubs or organizations: Never    Relationship status: Married  Other Topics Concern  . Not on file  Social History Narrative  . Not on file    Allergies: No Known Allergies  Metabolic Disorder Labs: No results found for: HGBA1C, MPG No results found for: PROLACTIN No results found for: CHOL, TRIG, HDL, CHOLHDL, VLDL, LDLCALC No results found for: TSH  Therapeutic Level Labs: No results found for: LITHIUM No results found for: VALPROATE No components found for:  CBMZ  Current Medications: Current Outpatient Medications  Medication Sig Dispense Refill  . atorvastatin (LIPITOR) 20 MG tablet Take by mouth.    . clonazePAM (KLONOPIN) 1 MG tablet Take 1 mg by mouth 2 (two) times daily as needed.    Marland Kitchen FLUoxetine (PROZAC) 20 MG capsule Take 2 capsules (40 mg total) by mouth daily. 60 capsule 0  . FLUoxetine (PROZAC) 20 MG capsule Take 3 capsules (60 mg total) by mouth daily. For mood sx 90 capsule 1  . hydrochlorothiazide (HYDRODIURIL) 25 MG tablet Take by mouth.    Marland Kitchen HYDROcodone-acetaminophen (LORTAB) 10-500 MG per tablet Take 1 tablet by mouth every 6 (six) hours as needed.    . mirtazapine (REMERON) 7.5 MG tablet Take 1 tablet (7.5 mg total) by mouth at bedtime. sleep 30 tablet 1  . RABEprazole (ACIPHEX) 20 MG tablet Take by mouth.     No current facility-administered medications for this visit.      Musculoskeletal: Strength & Muscle Tone: unable to assess Gait & Station: unable to assess Patient leans: N/A  Psychiatric Specialty Exam: Review of Systems  Psychiatric/Behavioral: Positive for depression. The patient is nervous/anxious and has insomnia.   All other systems reviewed and are negative.    There were no vitals taken for this visit.There is no height or weight on file to calculate BMI.  General Appearance: unable to assess  Eye Contact:  unable to assess  Speech:  Clear and Coherent  Volume:  Decreased  Mood:  Anxious and Depressed  Affect:  UTA  Thought Process:  Goal Directed and Descriptions of Associations: Intact  Orientation:  Full (Time, Place, and Person)  Thought Content: Logical   Suicidal Thoughts:  No  Homicidal Thoughts:  No  Memory:  Immediate;   Fair Recent;   Fair Remote;   Fair  Judgement:  Fair  Insight:  Fair  Psychomotor Activity:  unable to assess  Concentration:  Concentration: Fair and Attention Span: Fair  Recall:  [employer] of Knowledge: Fair  Language: Fair  Akathisia:  No  Handed:  Right  AIMS (if indicated): NA  Assets:  Communication Skills Desire for Improvement Social Support  ADL's:  Intact  Cognition: WNL  Sleep:  Poor   Screenings:   Assessment and Plan: Madison Morgan is a 48 yr old CF, married lives in [place], has a history of chronic pain, MDD, GAD, insomnia was evaluated by phone today. Patient continues to struggle with depression and sleep. Will continue to make medication changes as noted below.  Plan MDD- unstable Increase Prozac to 60 mg po daily. Continue CBT with Therapist Jodelle Green at [facility] counseling .  For GAD - unstable Increase Prozac to 60 mg po daily.  For Panic attacks - unstable She does have Klonopin available , limiting use. Continue CBT  For insomnia- Unstable Add Remeron 7.5 mg po qhs.  Follow up in clinic in 2 weeks or sooner if needed.  I have spent atleast 15 minutes non face to face with patient today. More than 50 % of the time was spent for psychoeducation and supportive psychotherapy and care coordination.      Jomarie Longs, MD 12/20/2018, 2:01 PM

## 2018-12-29 ENCOUNTER — Ambulatory Visit: Payer: BC Managed Care – PPO | Admitting: Licensed Clinical Social Worker

## 2019-01-04 ENCOUNTER — Ambulatory Visit (INDEPENDENT_AMBULATORY_CARE_PROVIDER_SITE_OTHER): Payer: BC Managed Care – PPO | Admitting: Psychiatry

## 2019-01-04 ENCOUNTER — Other Ambulatory Visit: Payer: Self-pay

## 2019-01-04 ENCOUNTER — Encounter: Payer: Self-pay | Admitting: Psychiatry

## 2019-01-04 DIAGNOSIS — F410 Panic disorder [episodic paroxysmal anxiety] without agoraphobia: Secondary | ICD-10-CM

## 2019-01-04 DIAGNOSIS — F5105 Insomnia due to other mental disorder: Secondary | ICD-10-CM

## 2019-01-04 DIAGNOSIS — F331 Major depressive disorder, recurrent, moderate: Secondary | ICD-10-CM | POA: Diagnosis not present

## 2019-01-04 DIAGNOSIS — F411 Generalized anxiety disorder: Secondary | ICD-10-CM | POA: Diagnosis not present

## 2019-01-04 MED ORDER — QUETIAPINE FUMARATE 25 MG PO TABS: 50.0000 mg | ORAL_TABLET | Freq: Every day | ORAL | 1 refills | Status: DC

## 2019-01-04 MED ORDER — FLUOXETINE HCL 20 MG PO CAPS: 20.0000 mg | ORAL_CAPSULE | ORAL | 0 refills | Status: DC

## 2019-01-04 MED ORDER — MIRTAZAPINE 30 MG PO TABS: 30.0000 mg | ORAL_TABLET | Freq: Every day | ORAL | 1 refills | Status: DC

## 2019-01-04 NOTE — Patient Instructions (Signed)
Quetiapine tablets  What is this medicine?  QUETIAPINE (kwe TYE a peen) is an antipsychotic. It is used to treat schizophrenia and bipolar disorder, also known as manic-depression.  This medicine may be used for other purposes; ask your health care provider or pharmacist if you have questions.  COMMON BRAND NAME(S): Seroquel  What should I tell my health care provider before I take this medicine?  They need to know if you have any of these conditions:  -blockage in your bowel  -cataracts  -constipation  -dehydration  -diabetes  -difficulty swallowing  -glaucoma  -heart disease  -history of breast cancer  -kidney disease  -liver disease  -low blood counts, like low white cell, platelet, or red cell counts  -low blood pressure or dizziness when standing up  -Parkinson's disease  -previous heart attack  -prostate disease  -seizures  -stomach or intestine problems  -suicidal thoughts, plans or attempt; a previous suicide attempt by you or a family member  -thyroid disease  -trouble passing urine  -an unusual or allergic reaction to quetiapine, other medicines, foods, dyes, or preservatives  -pregnant or trying to get pregnant  -breast-feeding  How should I use this medicine?  Take this medicine by mouth. Swallow it with a drink of water. Follow the directions on the prescription label. If it upsets your stomach you can take it with food. Take your medicine at regular intervals. Do not take it more often than directed. Do not stop taking except on the advice of your doctor or health care professional.  A special MedGuide will be given to you by the pharmacist with each prescription and refill. Be sure to read this information carefully each time.  Talk to your pediatrician regarding the use of this medicine in children. While this drug may be prescribed for children as young as 10 years for selected conditions, precautions do apply.  Patients over age 50 years may have a stronger reaction to this medicine and need  smaller doses.  Overdosage: If you think you have taken too much of this medicine contact a poison control center or emergency room at once.  NOTE: This medicine is only for you. Do not share this medicine with others.  What if I miss a dose?  If you miss a dose, take it as soon as you can. If it is almost time for your next dose, take only that dose. Do not take double or extra doses.  What may interact with this medicine?  Do not take this medicine with any of the following medications:  -cisapride  -dofetilide  -dronedarone  -fluconazole  -metoclopramide  -pimozide  -posaconazole  -thioridazine  This medicine may also interact with the following medications:  -alcohol  -antihistamines for allergy cough and cold  -antiviral medicines for HIV or AIDS  -atropine  -certain medicines for bladder problems like oxybutynin, tolterodine  -certain medicines for blood pressure  -certain medicines for depression, anxiety, or psychotic disturbances  -certain medicines for diabetes  -certain medicines for stomach problems like dicyclomine, hyoscyamine  -certain medicines for travel sickness like scopolamine  -certain medicines for Parkinson's disease  -certain medicines for seizures like carbamazepine, phenobarbital, phenytoin  -cimetidine  -erythromycin  -ipratropium  -other medicines that prolong the QT interval (cause an abnormal heart rhythm)  -rifampin  -steroid medicines like prednisone or cortisone  This list may not describe all possible interactions. Give your health care provider a list of all the medicines, herbs, non-prescription drugs, or dietary supplements you use. Also   tell them if you smoke, drink alcohol, or use illegal drugs. Some items may interact with your medicine.  What should I watch for while using this medicine?  Visit your doctor or health care professional for regular checks on your progress. It may be several weeks before you see the full effects of this medicine.  Your health care provider may  suggest that you have your eyes examined prior to starting this medicine, and every 6 months thereafter.  If you have been taking this medicine regularly for some time, do not suddenly stop taking it. You must gradually reduce the dose or your symptoms may get worse. Ask your doctor or health care professional for advice.  Patients and their families should watch out for worsening depression or thoughts of suicide. Also watch out for sudden or severe changes in feelings such as feeling anxious, agitated, panicky, irritable, hostile, aggressive, impulsive, severely restless, overly excited and hyperactive, or not being able to sleep. If this happens, especially at the beginning of antidepressant treatment or after a change in dose, call your health care professional.  You may get dizzy or drowsy. Do not drive, use machinery, or do anything that needs mental alertness until you know how this medicine affects you. Do not stand or sit up quickly, especially if you are an older patient. This reduces the risk of dizzy or fainting spells. Alcohol can increase dizziness and drowsiness. Avoid alcoholic drinks.  Do not treat yourself for colds, diarrhea or allergies. Ask your doctor or health care professional for advice, some ingredients may increase possible side effects.  This medicine can reduce the response of your body to heat or cold. Dress warm in cold weather and stay hydrated in hot weather. If possible, avoid extreme temperatures like saunas, hot tubs, very hot or cold showers, or activities that can cause dehydration such as vigorous exercise.  What side effects may I notice from receiving this medicine?  Side effects that you should report to your doctor or health care professional as soon as possible:  -allergic reactions like skin rash, itching or hives, swelling of the face, lips, or tongue  -changes in vision  -difficulty swallowing  -elevated mood, decreased need for sleep, racing thoughts, impulsive  behavior  -eye pain  -redness, blistering, peeling, or loosening of the skin, including inside the mouth  -restlessness, pacing, inability to keep still  -seizures  -signs and symptoms of a dangerous change in heartbeat or heart rhythm like chest pain; dizziness; fast, irregular heartbeat; palpitations; feeling faint or lightheaded; falls; breathing problems  -signs and symptoms of high blood sugar such as dizziness; dry mouth; dry skin; fruity breath; nausea; stomach pain; increased hunger; increased thirst; increased urination  -signs and symptoms of hypothyroidism like fatigue; increased sensitivity to cold; weight gain; hoarseness; thinning hair  -signs and symptoms of infection like fever; chills; cough; sore throat; pain or trouble passing urine  -signs and symptoms of low blood pressure like dizziness; feeling faint or lightheaded; falls; unusually weak or tired  -signs and symptoms of neuroleptic malignant syndrome (NMS) like confusion; fast, irregular heartbeat; high fever; increased sweating; stiff muscles  -signs and symptoms of a stroke like changes in vision; confusion; trouble speaking or understanding; severe headaches; sudden numbness or weakness of the face, arm or leg; trouble walking; dizziness; loss of balance or coordination  -signs and symptoms of tardive dyskinesia, like uncontrollable head, mouth, neck, arm, or leg movements  -suicidal thoughts, mood changes  Side effects that usually do   not require medical attention (report to your doctor or health care professional if they continue or are bothersome):  -change in sex drive or performance  -constipation  -drowsiness  -dry mouth  -upset stomach  -weight gain  This list may not describe all possible side effects. Call your doctor for medical advice about side effects. You may report side effects to FDA at 1-800-FDA-1088.  Where should I keep my medicine?  Keep out of the reach of children.  Store at room temperature between 15 and 30 degrees C  (59 and 86 degrees F). Throw away any unused medicine after the expiration date.  NOTE: This sheet is a summary. It may not cover all possible information. If you have questions about this medicine, talk to your doctor, pharmacist, or health care provider.   2019 Elsevier/Gold Standard (2017-08-28 14:16:00)

## 2019-01-04 NOTE — Progress Notes (Signed)
TC on 01-04-19 @ 1:57 pt medical and surgical hx were reviewed with no changes.  Allergies were reviewed with no new changes. Pt medications and pharmacy were reviewed with updates. No vitals taken due to this is a phone consult.

## 2019-01-04 NOTE — Progress Notes (Signed)
Virtual Visit via Telephone Note  I connected with Madison Morgan on 01/04/19 at  4:00 PM EDT by telephone and verified that I am speaking with the correct person using two identifiers.   I discussed the limitations, risks, security and privacy concerns of performing an evaluation and management service by telephone and the availability of in person appointments. I also discussed with the patient that there may be a patient responsible charge related to this service. The patient expressed understanding and agreed to proceed.    I discussed the assessment and treatment plan with the patient. The patient was provided an opportunity to ask questions and all were answered. The patient agreed with the plan and demonstrated an understanding of the instructions.   The patient was advised to call back or seek an in-person evaluation if the symptoms worsen or if the condition fails to improve as anticipated.   [facility] MD OP Progress Note  01/04/2019 4:11 PM Madison FreshwaterChandra S Morgan  MRN:  [id number]  Chief Complaint:  Chief Complaint    Follow-up     HPI: Madison BurlyChandra is a 48 year old Caucasian female, currently on FMLA, lives in [place], has a history of MDD, GAD, panic attacks, insomnia, tobacco use disorder, was evaluated by phone today.  Patient reports she continues to struggle with depression and anxiety symptoms.  She reports she is not tolerating the higher dosage of Prozac.  She reports she has been struggling with nausea ever since she went up on the dosage.  She reports she also has not noticed any good changes from going up on the medication.  Patient also reports anxiety symptoms on a regular basis.  Patient reports she has been struggling with sleep.  She takes mirtazapine 7.5 mg which is not helping with her sleep either.    Patient denies any suicidality, homicidality or perceptual disturbances.  Patient is alert, oriented to person place and situation.  Patient continues to [facility] once a week and reports it is beneficial.  Discussed with patient about medication changes.  Patient has tried and failed multiple medications in the past including Paxil, Celexa, Lexapro, Effexor, Wellbutrin, Pamelor, Abilify.  Patient also at some point was on lithium, Cymbalta and Seroquel.  Patient however does not know why those medications were stopped.  Patient has not been able to request I will get her records from her previous provider Dr. Imogene Morgan yet.  Discussed with patient about restarting Seroquel at low-dose to address her sleep.  She agrees with plan.  Discussed increasing her mirtazapine to a higher dosage.  Also discussed changing the timing of her Prozac to morning and tapering it off gradually.  Provided her instructions to do so.  Discussed alternative treatment plan like referral for IOP program if she continues to struggle and she agrees with plan. Visit Diagnosis:    ICD-10-CM   1. MDD (major depressive disorder), recurrent episode, moderate (HCC) F33.1 FLUoxetine (PROZAC) 20 MG capsule    mirtazapine (REMERON) 30 MG tablet    QUEtiapine (SEROQUEL) 25 MG tablet  2. GAD (generalized anxiety disorder) F41.1 FLUoxetine (PROZAC) 20 MG capsule    mirtazapine (REMERON) 30 MG tablet    QUEtiapine (SEROQUEL) 25 MG tablet  3. Panic attacks F41.0 FLUoxetine (PROZAC) 20 MG capsule    mirtazapine (REMERON) 30 MG tablet    QUEtiapine (SEROQUEL) 25 MG tablet  4. Insomnia due to mental condition F51.05 QUEtiapine (SEROQUEL) 25 MG tablet    Past Psychiatric History: Reviewed  past psychiatric history from my progress note on 12/01/2018.  Past Medical History:  Past Medical History:  Diagnosis Date  . Anxiety   . Chronic back pain   . Depression   . Fibromyalgia   . Hyperlipemia    currently taking lipitor    Past Surgical History:  Procedure Laterality Date  . BREAST BIOPSY Right 2012   negative, stereotactic biopsy  . foot sugery Bilateral      Family Psychiatric History: Reviewed family psychiatric history from my progress note on 12/01/2018.  Family History:  Family History  Problem Relation Age of Onset  . Depression Father   . Breast cancer Neg Hx     Social History: Reviewed social history from my progress note on 12/01/2018. Social History   Socioeconomic History  . Marital status: Married    Spouse name: Corporate treasurer  . Number of children: 2  . Years of education: Not on file  . Highest education level: High school graduate  Occupational History    Comment: full time  Social Needs  . Financial resource strain: Not hard at all  . Food insecurity:    Worry: Never true    Inability: Never true  . Transportation needs:    Medical: No    Non-medical: No  Tobacco Use  . Smoking status: Current Every Day Smoker    Packs/day: 1.00    Types: Cigarettes  . Smokeless tobacco: Never Used  Substance and Sexual Activity  . Alcohol use: No  . Drug use: No  . Sexual activity: Yes  Lifestyle  . Physical activity:    Days per week: 0 days    Minutes per session: 0 min  . Stress: Very much  Relationships  . Social connections:    Talks on phone: Not on file    Gets together: Not on file    Attends religious service: Never    Active member of club or organization: No    Attends meetings of clubs or organizations: Never    Relationship status: Married  Other Topics Concern  . Not on file  Social History Narrative  . Not on file    Allergies: No Known Allergies  Metabolic Disorder Labs: No results found for: HGBA1C, MPG No results found for: PROLACTIN No results found for: CHOL, TRIG, HDL, CHOLHDL, VLDL, LDLCALC No results found for: TSH  Therapeutic Level Labs: No results found for: LITHIUM No results found for: VALPROATE No components found for:  CBMZ  Current Medications: Current Outpatient Medications  Medication Sig Dispense Refill  . atorvastatin (LIPITOR) 20 MG tablet Take by mouth.    Madison Morgan  FLUoxetine (PROZAC) 20 MG capsule Take 1 capsule (20 mg total) by mouth as directed. Take it every other day for 5 days and stop 5 capsule 0  . hydrochlorothiazide (HYDRODIURIL) 25 MG tablet Take by mouth.    Madison Morgan HYDROcodone-acetaminophen (NORCO) 10-325 MG tablet Take by mouth.    . RABEprazole (ACIPHEX) 20 MG tablet Take by mouth.    . mirtazapine (REMERON) 30 MG tablet Take 1 tablet (30 mg total) by mouth at bedtime. 30 tablet 1  . QUEtiapine (SEROQUEL) 25 MG tablet Take 2 tablets (50 mg total) by mouth at bedtime. Take 1 tablet for 10 days and increase to 2 tablets . 60 tablet 1   No current facility-administered medications for this visit.      Musculoskeletal: Strength & Muscle Tone: UTA Gait & Station: UTA Patient leans: N/A  Psychiatric Specialty Exam: Review of Systems  Psychiatric/Behavioral: Positive for depression. The patient is nervous/anxious and has insomnia.   All other systems reviewed and are negative.   There were no vitals taken for this visit.There is no height or weight on file to calculate BMI.  General Appearance: UTA  Eye Contact:  UTA  Speech:  Normal Rate  Volume:  Decreased  Mood:  Anxious and Depressed  Affect:  UTA  Thought Process:  Goal Directed and Descriptions of Associations: Intact  Orientation:  Full (Time, Place, and Person)  Thought Content: Logical   Suicidal Thoughts:  No  Homicidal Thoughts:  No  Memory:  Immediate;   Fair Recent;   Fair Remote;   Fair  Judgement:  Fair  Insight:  Fair  Psychomotor Activity:  UTA  Concentration:  Concentration: Fair and Attention Span: Fair  Recall:  [employer] of Knowledge: Fair  Language: Fair  Akathisia:  No  Handed:  Right  AIMS (if indicated): Denies tremors, rigidity, stiffness  Assets:  Communication Skills Desire for Improvement Social Support  ADL's:  Intact  Cognition: WNL  Sleep:  Poor   Screenings:   Assessment and Plan: Madison Morgan is a 48 year old Caucasian female, married,  lives in [place], has a history of chronic pain, MDD, GAD, insomnia was evaluated by phone today.  Patient continues to struggle with mood and sleep.  We will continue to need medication changes.  Patient with several psychosocial stressors including multiple medical problems, job related stressors, currently on FMLA.  Plan as noted below.  Plan MDD-unstable Taper of Prozac.  Provided her instructions to reduce the dosage to 20 mg and stop it after 5 days. Increase mirtazapine to 30 mg p.o. nightly Add Seroquel 25 mg for 10 days and increase to 50 mg after that. Continue CBT with Denny Levy at [facility] center.  For GAD-unstable Mirtazapine and Seroquel as prescribed. Continue CBT. Offered referral for IOP if she continues to decompensate.  For panic attacks- unstable She does have Klonopin available, limiting use.  Continue CBT. Mirtazapine will also help.  For insomnia- unstable Discussed with patient to take the Prozac in the morning. Seroquel 25 to 50 mg at bedtime.  Follow-up in clinic in 3 to 4 weeks or sooner if needed.  I have spent atleast 15 minutes non face to face with patient today. More than 50 % of the time was spent for psychoeducation and supportive psychotherapy and care coordination.  This note was generated in part or whole with voice recognition software. Voice recognition is usually quite accurate but there are transcription errors that can and very often do occur. I apologize for any typographical errors that were not detected and corrected.        Jomarie Longs, MD 01/04/2019, 4:11 PM

## 2019-01-27 ENCOUNTER — Encounter: Payer: Self-pay | Admitting: Psychiatry

## 2019-01-27 ENCOUNTER — Other Ambulatory Visit: Payer: Self-pay

## 2019-01-27 ENCOUNTER — Ambulatory Visit (INDEPENDENT_AMBULATORY_CARE_PROVIDER_SITE_OTHER): Payer: BC Managed Care – PPO | Admitting: Psychiatry

## 2019-01-27 DIAGNOSIS — F410 Panic disorder [episodic paroxysmal anxiety] without agoraphobia: Secondary | ICD-10-CM

## 2019-01-27 DIAGNOSIS — F5105 Insomnia due to other mental disorder: Secondary | ICD-10-CM

## 2019-01-27 DIAGNOSIS — F331 Major depressive disorder, recurrent, moderate: Secondary | ICD-10-CM

## 2019-01-27 DIAGNOSIS — F411 Generalized anxiety disorder: Secondary | ICD-10-CM | POA: Diagnosis not present

## 2019-01-27 DIAGNOSIS — F17200 Nicotine dependence, unspecified, uncomplicated: Secondary | ICD-10-CM

## 2019-01-27 MED ORDER — MIRTAZAPINE 45 MG PO TABS: 45.0000 mg | ORAL_TABLET | Freq: Every day | ORAL | 0 refills | Status: DC

## 2019-01-27 NOTE — Progress Notes (Signed)
Virtual Visit via Telephone Note  I connected with Ronnell Freshwaterhandra S Wnek on 01/27/19 at 10:00 AM EDT by telephone and verified that I am speaking with the correct person using two identifiers.   I discussed the limitations, risks, security and privacy concerns of performing an evaluation and management service by telephone and the availability of in person appointments. I also discussed with the patient that there may be a patient responsible charge related to this service. The patient expressed understanding and agreed to proceed.    I discussed the assessment and treatment plan with the patient. The patient was provided an opportunity to ask questions and all were answered. The patient agreed with the plan and demonstrated an understanding of the instructions.   The patient was advised to call back or seek an in-person evaluation if the symptoms worsen or if the condition fails to improve as anticipated.   BH MD OP Progress Note  01/27/2019 1:17 PM Ronnell FreshwaterChandra S Strubel  MRN:  [id number]  Chief Complaint:  Chief Complaint    Follow-up     HPI: Karren BurlyChandra is a 48 year old Caucasian female, employed, lives in [place], has a history of MDD, GAD, panic attacks, insomnia, tobacco use disorder was evaluated by phone today.  Patient was offered video consult however declined.  Patient today reports she is currently making progress with her anxiety and sleep.  She reports she is tolerating the mirtazapine well.  Her sleep has improved with the Seroquel combination.  She however reports she continues to struggle with lack of motivation.  She reports she has been struggling with it since the past several months.  She reports she has not been able to find enjoyment in the things that she enjoyed previously.  That is a big concern for her.  She is currently back at work.  Her workload is very low at this time.  She reports that has been very helpful.  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  Patient continues to be in therapy sessions with her therapist Denny LevyJessica Coble.  She reports the therapy sessions is helpful.  Patient denies any suicidality, homicidality or perceptual disturbances.  Patient is alert, oriented to person place and situation. Visit Diagnosis:    ICD-10-CM   1. MDD (major depressive disorder), recurrent episode, moderate (HCC) F33.1 mirtazapine (REMERON) 45 MG tablet  2. GAD (generalized anxiety disorder) F41.1 mirtazapine (REMERON) 45 MG tablet  3. Panic attacks F41.0 mirtazapine (REMERON) 45 MG tablet  4. Insomnia due to mental condition F51.05   5. Tobacco use disorder F17.200     Past Psychiatric History: Have reviewed past psychiatric history from my progress note on 12/01/2018.  Past Medical History:  Past Medical History:  Diagnosis Date  . Anxiety   . Chronic back pain   . Depression   . Fibromyalgia   . Hyperlipemia    currently taking lipitor    Past Surgical History:  Procedure Laterality Date  . BREAST BIOPSY Right 2012   negative, stereotactic biopsy  . foot sugery Bilateral     Family Psychiatric History: Reviewed family psychiatric history from my progress note on 12/01/2018.  Family History:  Family History  Problem Relation Age of Onset  . Depression Father   . Breast cancer Neg Hx     Social History: Reviewed social history from my progress note on 12/01/2018. Social History   Socioeconomic History  . Marital status:  Married    Spouse name: Corporate treasurer  . Number of children: 2  . Years of education: Not on file  . Highest education level: High school graduate  Occupational History    Comment: full time  Social Needs  . Financial resource strain: Not hard at all  . Food insecurity:    Worry: Never true    Inability: Never true  . Transportation needs:    Medical: No    Non-medical: No  Tobacco Use  . Smoking status: Current Every Day Smoker    Packs/day:  1.00    Types: Cigarettes  . Smokeless tobacco: Never Used  Substance and Sexual Activity  . Alcohol use: No  . Drug use: No  . Sexual activity: Yes  Lifestyle  . Physical activity:    Days per week: 0 days    Minutes per session: 0 min  . Stress: Very much  Relationships  . Social connections:    Talks on phone: Not on file    Gets together: Not on file    Attends religious service: Never    Active member of club or organization: No    Attends meetings of clubs or organizations: Never    Relationship status: Married  Other Topics Concern  . Not on file  Social History Narrative  . Not on file    Allergies: No Known Allergies  Metabolic Disorder Labs: No results found for: HGBA1C, MPG No results found for: PROLACTIN No results found for: CHOL, TRIG, HDL, CHOLHDL, VLDL, LDLCALC No results found for: TSH  Therapeutic Level Labs: No results found for: LITHIUM No results found for: VALPROATE No components found for:  CBMZ  Current Medications: Current Outpatient Medications  Medication Sig Dispense Refill  . atorvastatin (LIPITOR) 20 MG tablet Take by mouth.    . hydrochlorothiazide (HYDRODIURIL) 25 MG tablet Take by mouth.    Marland Kitchen HYDROcodone-acetaminophen (NORCO) 10-325 MG tablet Take by mouth.    . mirtazapine (REMERON) 45 MG tablet Take 1 tablet (45 mg total) by mouth at bedtime. 30 tablet 0  . QUEtiapine (SEROQUEL) 25 MG tablet Take 2 tablets (50 mg total) by mouth at bedtime. Take 1 tablet for 10 days and increase to 2 tablets . 60 tablet 1  . RABEprazole (ACIPHEX) 20 MG tablet Take by mouth.     No current facility-administered medications for this visit.      Musculoskeletal: Strength & Muscle Tone: UTA Gait & Station: UTA Patient leans: N/A  Psychiatric Specialty Exam: Review of Systems  Psychiatric/Behavioral: Positive for depression. The patient is nervous/anxious.   All other systems reviewed and are negative.   There were no vitals taken for this  visit.There is no height or weight on file to calculate BMI.  General Appearance: UTA  Eye Contact:  UTA  Speech:  Clear and Coherent  Volume:  Normal  Mood:  Depressed  Affect:  UTA  Thought Process:  Goal Directed and Descriptions of Associations: Intact  Orientation:  Full (Time, Place, and Person)  Thought Content: Logical   Suicidal Thoughts:  No  Homicidal Thoughts:  No  Memory:  Immediate;   Fair Recent;   Fair Remote;   Fair  Judgement:  Fair  Insight:  Fair  Psychomotor Activity:  UTA  Concentration:  Concentration: Fair and Attention Span: Fair  Recall:  [employer] of Knowledge: Fair  Language: Fair  Akathisia:  No  Handed:  Right  AIMS (if indicated): denies tremors, rigidity,stiffness  Assets:  Manufacturing systems engineer  Desire for Improvement Housing Social Support  ADL's:  Intact  Cognition: WNL  Sleep:  improving   Screenings:   Assessment and Plan: Siyona is a 48 year old Caucasian female, married, lives in [place], has a history of chronic pain, MDD, GAD, insomnia was evaluated by phone today.  Patient continues to struggle with lack of motivation although her anxiety and sleep have improved.  Patient will continue to benefit from medication management as well as psychotherapy sessions.  Plan MDD- some progress Increase mirtazapine to 45 mg p.o. nightly Seroquel 50 mg p.o. nightly Continue CBT with [facility]enny [facility] at [facility].  For GAD-improving Mirtazapine and Seroquel as prescribed Continue CBT  For panic attacks-improving Continue CBT.  For insomnia- stable Seroquel 50 mg po at bedtime.  Follow-up in clinic in 3 to 4 weeks or sooner if needed, appointment scheduled for June 3 at 10:30 AM.  I have spent atleast 15 minutes non  face to face with patient today. More than 50 % of the time was spent for psychoeducation and supportive psychotherapy and care coordination.  This note was generated in part or whole with voice recognition  software. Voice recognition is usually quite accurate but there are transcription errors that can and very often do occur. I apologize for any typographical errors that were not detected and corrected.       Jomarie Longs, MD 01/27/2019, 1:17 PM

## 2019-02-16 ENCOUNTER — Ambulatory Visit: Payer: BC Managed Care – PPO | Attending: Neurology

## 2019-02-16 DIAGNOSIS — R0683 Snoring: Secondary | ICD-10-CM | POA: Insufficient documentation

## 2019-02-16 DIAGNOSIS — R5382 Chronic fatigue, unspecified: Secondary | ICD-10-CM | POA: Insufficient documentation

## 2019-02-17 ENCOUNTER — Other Ambulatory Visit: Payer: Self-pay

## 2019-02-23 ENCOUNTER — Ambulatory Visit (INDEPENDENT_AMBULATORY_CARE_PROVIDER_SITE_OTHER): Payer: BC Managed Care – PPO | Admitting: Psychiatry

## 2019-02-23 ENCOUNTER — Encounter: Payer: Self-pay | Admitting: Psychiatry

## 2019-02-23 ENCOUNTER — Other Ambulatory Visit: Payer: Self-pay

## 2019-02-23 DIAGNOSIS — F17200 Nicotine dependence, unspecified, uncomplicated: Secondary | ICD-10-CM

## 2019-02-23 DIAGNOSIS — F5105 Insomnia due to other mental disorder: Secondary | ICD-10-CM | POA: Diagnosis not present

## 2019-02-23 DIAGNOSIS — F410 Panic disorder [episodic paroxysmal anxiety] without agoraphobia: Secondary | ICD-10-CM | POA: Diagnosis not present

## 2019-02-23 DIAGNOSIS — F411 Generalized anxiety disorder: Secondary | ICD-10-CM | POA: Diagnosis not present

## 2019-02-23 DIAGNOSIS — F331 Major depressive disorder, recurrent, moderate: Secondary | ICD-10-CM | POA: Diagnosis not present

## 2019-02-23 MED ORDER — MIRTAZAPINE 45 MG PO TABS: 45.0000 mg | ORAL_TABLET | Freq: Every day | ORAL | 1 refills | Status: DC

## 2019-02-23 MED ORDER — BUPROPION HCL 75 MG PO TABS: 75.0000 mg | ORAL_TABLET | Freq: Every day | ORAL | 1 refills | Status: DC

## 2019-02-23 MED ORDER — QUETIAPINE FUMARATE 25 MG PO TABS: 25.0000 mg | ORAL_TABLET | Freq: Every day | ORAL | 1 refills | Status: DC

## 2019-02-23 NOTE — Progress Notes (Signed)
Virtual Visit via Video Note  I connected with Madison Morgan on 02/23/19 at 10:30 AM EDT by a video enabled telemedicine application and verified that I am speaking with the correct person using two identifiers.   I discussed the limitations of evaluation and management by telemedicine and the availability of in person appointments. The patient expressed understanding and agreed to proceed.   I discussed the assessment and treatment plan with the patient. The patient was provided an opportunity to ask questions and all were answered. The patient agreed with the plan and demonstrated an understanding of the instructions.   The patient was advised to call back or seek an in-person evaluation if the symptoms worsen or if the condition fails to improve as anticipated.   BH MD OP Progress Note  02/23/2019 5:52 PM Madison Morgan [facility]  MRN:  [id number]  Chief Complaint:  Chief Complaint    Follow-up     HPI: Madison Morgan is a 48 year old Caucasian female, lives in [place], has a history of MDD, GAD, panic attacks, insomnia, chronic pain, chronic fatigue, hyperlipidemia was evaluated by telemedicine today.  Patient reports she started going back to work again.  She reports work continues to be stressful.  She is currently training for a new position.  She however reports there are days when she struggles with lack of motivation, low energy and sadness.  She reports she has had to call out of work yesterday since she was not feeling well.  Patient reports she also has been sleeping too deep.  She reports she has been sleeping through her alarms.  This has never happened before.  Patient reports her anxiety symptoms are more under control.  She denies any panic attacks.  Patient reports she has been gaining a lot of weight and has gained at least 15 pounds since the past 2 months.  She wonders if this is a side effect of the medication.  Discussed with patient that both Seroquel and mirtazapine can cause  weight gain.  Discussed with her since her Seroquel and mirtazapine combination is also affecting her sleep to be too deep will readjust the dosage today.  Advised her to watch her diet.  Also advised her to exercise.  She continues to smoke cigarettes and reports she has been trying to cut back.  Patient denies any suicidality, homicidality or perceptual disturbances. Visit Diagnosis:    ICD-10-CM   1. MDD (major depressive disorder), recurrent episode, moderate (HCC) F33.1 QUEtiapine (SEROQUEL) 25 MG tablet    mirtazapine (REMERON) 45 MG tablet    buPROPion (WELLBUTRIN) 75 MG tablet  2. GAD (generalized anxiety disorder) F41.1 QUEtiapine (SEROQUEL) 25 MG tablet    mirtazapine (REMERON) 45 MG tablet  3. Panic attacks F41.0 QUEtiapine (SEROQUEL) 25 MG tablet    mirtazapine (REMERON) 45 MG tablet  4. Insomnia due to mental condition F51.05 QUEtiapine (SEROQUEL) 25 MG tablet  5. Tobacco use disorder F17.200     Past Psychiatric History: Reviewed past psychiatric history from my progress note on 12/01/2018.  Past trials of Cymbalta, lithium, Prozac, clonazepam.  Past Medical History:  Past Medical History:  Diagnosis Date  . Anxiety   . Chronic back pain   . Depression   . Fibromyalgia   . Hyperlipemia    currently taking lipitor    Past Surgical History:  Procedure Laterality Date  . BREAST BIOPSY Right 2012   negative, stereotactic biopsy  . foot sugery Bilateral     Family Psychiatric History: I have reviewed  family psychiatric history from my progress note on 12/01/2018.  Family History:  Family History  Problem Relation Age of Onset  . Depression Father   . Breast cancer Neg Hx     Social History: Reviewed social history from my progress note on 12/01/2018 Social History   Socioeconomic History  . Marital status: Married    Spouse name: Corporate treasurerchristopher  . Number of children: 2  . Years of education: Not on file  . Highest education level: High school graduate   Occupational History    Comment: full time  Social Needs  . Financial resource strain: Not hard at all  . Food insecurity:    Worry: Never true    Inability: Never true  . Transportation needs:    Medical: No    Non-medical: No  Tobacco Use  . Smoking status: Current Every Day Smoker    Packs/day: 1.00    Types: Cigarettes  . Smokeless tobacco: Never Used  Substance and Sexual Activity  . Alcohol use: No  . Drug use: No  . Sexual activity: Yes  Lifestyle  . Physical activity:    Days per week: 0 days    Minutes per session: 0 min  . Stress: Very much  Relationships  . Social connections:    Talks on phone: Not on file    Gets together: Not on file    Attends religious service: Never    Active member of club or organization: No    Attends meetings of clubs or organizations: Never    Relationship status: Married  Other Topics Concern  . Not on file  Social History Narrative  . Not on file    Allergies: No Known Allergies  Metabolic Disorder Labs: No results found for: HGBA1C, MPG No results found for: PROLACTIN No results found for: CHOL, TRIG, HDL, CHOLHDL, VLDL, LDLCALC No results found for: TSH  Therapeutic Level Labs: No results found for: LITHIUM No results found for: VALPROATE No components found for:  CBMZ  Current Medications: Current Outpatient Medications  Medication Sig Dispense Refill  . atorvastatin (LIPITOR) 20 MG tablet Take by mouth.    Marland Kitchen. buPROPion (WELLBUTRIN) 75 MG tablet Take 1 tablet (75 mg total) by mouth daily with breakfast. 30 tablet 1  . hydrochlorothiazide (HYDRODIURIL) 25 MG tablet Take by mouth.    Marland Kitchen. HYDROcodone-acetaminophen (NORCO) 10-325 MG tablet Take by mouth.    . mirtazapine (REMERON) 45 MG tablet Take 1 tablet (45 mg total) by mouth at bedtime. 30 tablet 1  . QUEtiapine (SEROQUEL) 25 MG tablet Take 1 tablet (25 mg total) by mouth at bedtime. 30 tablet 1  . RABEprazole (ACIPHEX) 20 MG tablet Take by mouth.     No current  facility-administered medications for this visit.      Musculoskeletal: Strength & Muscle Tone: within normal limits Gait & Station: normal Patient leans: N/A  Psychiatric Specialty Exam: Review of Systems  Psychiatric/Behavioral: Positive for depression.  All other systems reviewed and are negative.   There were no vitals taken for this visit.There is no height or weight on file to calculate BMI.  General Appearance: Casual  Eye Contact:  Fair  Speech:  Clear and Coherent  Volume:  Normal  Mood:  Depressed  Affect:  Appropriate  Thought Process:  Goal Directed and Descriptions of Associations: Intact  Orientation:  Full (Time, Place, and Person)  Thought Content: Logical   Suicidal Thoughts:  No  Homicidal Thoughts:  No  Memory:  Immediate;  Fair Recent;   Fair Remote;   Fair  Judgement:  Fair  Insight:  Fair  Psychomotor Activity:  Normal  Concentration:  Concentration: Fair and Attention Span: Fair  Recall:  [employer] of Knowledge: Fair  Language: Fair  Akathisia:  No  Handed:  Right  AIMS (if indicated): denies tremors, rigidity  Assets:  Communication Skills Desire for Improvement Social Support  ADL's:  Intact  Cognition: WNL  Sleep:  excessive   Screenings:   Assessment and Plan: Madison Morgan is a  48 year old Caucasian female, married, lives in [place], has a history of chronic pain, fatigue, anxiety, depression, hyperlipidemia, history of EBV infection was evaluated by telemedicine today.  She is biologically predisposed given her multiple medical problems, family history as well as history of trauma.  Patient also has psychosocial stressors of dealing with chronic health problems.  She continues to struggle with depression and will benefit from medication readjustment.  Plan MDD-unstable Continue mirtazapine 45 mg p.o. nightly Reduce Seroquel to 25 mg p.o. nightly. Add Wellbutrin 75 mg p.o. daily Continue CBT with Denny [facility] at [facility]  For GAD-improving Mirtazapine and Seroquel as prescribed Continue CBT  For panic attacks-stable Continue CBT  For insomnia- stable However reduce her Seroquel to 25 mg p.o. nightly at bedtime since she has been sleeping too deep.  Follow-up in clinic in 4 weeks or sooner if needed.  June 30 at 8:45 in the morning  I have spent atleast 15 MINUTES non face to face with patient today. More than 50 % of the time was spent for psychoeducation and supportive psychotherapy and care coordination.  This note was generated in part or whole with voice recognition software. Voice recognition is usually quite accurate but there are transcription errors that can and very often do occur. I apologize for any typographical errors that were not detected and corrected.       Jomarie Longs, MD 02/23/2019, 5:52 PM

## 2019-03-22 ENCOUNTER — Ambulatory Visit (INDEPENDENT_AMBULATORY_CARE_PROVIDER_SITE_OTHER): Payer: BC Managed Care – PPO | Admitting: Psychiatry

## 2019-03-22 ENCOUNTER — Other Ambulatory Visit: Payer: Self-pay

## 2019-03-22 ENCOUNTER — Encounter: Payer: Self-pay | Admitting: Psychiatry

## 2019-03-22 DIAGNOSIS — F410 Panic disorder [episodic paroxysmal anxiety] without agoraphobia: Secondary | ICD-10-CM

## 2019-03-22 DIAGNOSIS — F331 Major depressive disorder, recurrent, moderate: Secondary | ICD-10-CM | POA: Diagnosis not present

## 2019-03-22 DIAGNOSIS — F5105 Insomnia due to other mental disorder: Secondary | ICD-10-CM

## 2019-03-22 DIAGNOSIS — F411 Generalized anxiety disorder: Secondary | ICD-10-CM | POA: Diagnosis not present

## 2019-03-22 DIAGNOSIS — F17200 Nicotine dependence, unspecified, uncomplicated: Secondary | ICD-10-CM

## 2019-03-22 MED ORDER — BUPROPION HCL ER (XL) 150 MG PO TB24: 150.0000 mg | ORAL_TABLET | Freq: Every day | ORAL | 1 refills | Status: DC

## 2019-03-22 MED ORDER — PROPRANOLOL HCL 10 MG PO TABS: 10.0000 mg | ORAL_TABLET | Freq: Two times a day (BID) | ORAL | 2 refills | Status: DC | PRN

## 2019-03-22 NOTE — Progress Notes (Signed)
Virtual Visit via Video Note  I connected with Madison Morgan on 03/22/19 at  8:45 AM EDT by a video enabled telemedicine application and verified that I am speaking with the correct person using two identifiers.   I discussed the limitations of evaluation and management by telemedicine and the availability of in person appointments. The patient expressed understanding and agreed to proceed.    I discussed the assessment and treatment plan with the patient. The patient was provided an opportunity to ask questions and all were answered. The patient agreed with the plan and demonstrated an understanding of the instructions.   The patient was advised to call back or seek an in-person evaluation if the symptoms worsen or if the condition fails to improve as anticipated.   BH MD OP Progress Note  03/22/2019 12:32 PM Madison Morgan  MRN:  [id number]  Chief Complaint:  Chief Complaint    Follow-up     HPI: Madison Morgan is a 48 year old Caucasian female, lives in [place], has a history of MDD, GAD, panic attacks, insomnia, chronic fatigue, hyperlipidemia was evaluated by telemedicine today.  Patient reports she continues to struggle with lack of motivation, anhedonia and sadness.  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  She reports she is able to work and when she stays busy she feels okay.  However she often feels sad and frustrated.  She reports she is frustrated about the way she feels and that she is not making much progress.  She has not noticed much benefit from the Wellbutrin that.  She is not sure if the Wellbutrin is increasing her anger or irritability.  She reports she does not have any physical symptoms of irritability however she just feels an internal rage which she does not know if she had it before the Wellbutrin or if it is getting worse.  She however agrees to monitor herself closely.  She is agreeable  to going up on the Wellbutrin at this time.  She also reports side effects to Seroquel.  She reports she feels groggy and tired on the Seroquel.  She hence went without the Seroquel for a few nights and reports she can sleep good just on the mirtazapine.  Patient reports she continues to struggle with panic symptoms.  She reports she is okay driving to work however when she has to drive to other places she goes into a panic mood and feels anxious and nervous.  She continues to work with therapist however has not been seeing the therapist very frequently due to the COVID-19 outbreak.  Discussed with her that if she is having lot of panic symptoms especially in situations like driving she would benefit from more intensive psychotherapy sessions.  Also discussed adding propranolol as needed for her panic symptoms and she agrees with plan.  Patient denies any suicidality, homicidality or perceptual disturbances.  She denies any other concerns today. Visit Diagnosis:    ICD-10-CM   1. MDD (major depressive disorder), recurrent episode, moderate (HCC)  F33.1 buPROPion (WELLBUTRIN XL) 150 MG 24 hr tablet  2. GAD (generalized anxiety disorder)  F41.1 propranolol (INDERAL) 10 MG tablet  3. Panic attacks  F41.0 propranolol (INDERAL) 10 MG tablet  4. Insomnia due to mental condition  F51.05   5. Tobacco use disorder  F17.200     Past Psychiatric History: Reviewed past psychiatric history from my progress note on 12/01/2018.  Past trials of Cymbalta, lithium, Prozac, clonazepam.  Past Medical History:  Past Medical History:  Diagnosis Date  . Anxiety   . Chronic back pain   . Depression   . Fibromyalgia   . Hyperlipemia    currently taking lipitor    Past Surgical History:  Procedure Laterality Date  . BREAST BIOPSY Right 2012   negative, stereotactic biopsy  . foot sugery Bilateral     Family Psychiatric History: Family psychiatric history from my progress note on 12/01/2018.  Family  History:  Family History  Problem Relation Age of Onset  . Depression Father   . Breast cancer Neg Hx     Social History: Reviewed social history from my progress note on 12/01/2018. Social History   Socioeconomic History  . Marital status: Married    Spouse name: Madison Morgan  . Number of children: 2  . Years of education: Not on file  . Highest education level: High school graduate  Occupational History    Comment: full time  Social Needs  . Financial resource strain: Not hard at all  . Food insecurity    Worry: Never true    Inability: Never true  . Transportation needs    Medical: No    Non-medical: No  Tobacco Use  . Smoking status: Current Every Day Smoker    Packs/day: 1.00    Types: Cigarettes  . Smokeless tobacco: Never Used  Substance and Sexual Activity  . Alcohol use: No  . Drug use: No  . Sexual activity: Yes  Lifestyle  . Physical activity    Days per week: 0 days    Minutes per session: 0 min  . Stress: Very much  Relationships  . Social Herbalist on phone: Not on file    Gets together: Not on file    Attends religious service: Never    Active member of club or organization: No    Attends meetings of clubs or organizations: Never    Relationship status: Married  Other Topics Concern  . Not on file  Social History Narrative  . Not on file    Allergies: No Known Allergies  Metabolic Disorder Labs: No results found for: HGBA1C, MPG No results found for: PROLACTIN No results found for: CHOL, TRIG, HDL, CHOLHDL, VLDL, LDLCALC No results found for: TSH  Therapeutic Level Labs: No results found for: LITHIUM No results found for: VALPROATE No components found for:  CBMZ  Current Medications: Current Outpatient Medications  Medication Sig Dispense Refill  . triamterene-hydrochlorothiazide (MAXZIDE) 75-50 MG tablet Take by mouth.    Marland Kitchen atorvastatin (LIPITOR) 20 MG tablet Take by mouth.    Marland Kitchen buPROPion (WELLBUTRIN XL) 150 MG 24 hr  tablet Take 1 tablet (150 mg total) by mouth daily. 15 tablet 1  . hydrochlorothiazide (HYDRODIURIL) 25 MG tablet Take by mouth.    Marland Kitchen HYDROcodone-acetaminophen (NORCO) 10-325 MG tablet Take by mouth.    . mirtazapine (REMERON) 45 MG tablet Take 1 tablet (45 mg total) by mouth at bedtime. 30 tablet 1  . propranolol (INDERAL) 10 MG tablet Take 1 tablet (10 mg total) by mouth 2 (two) times daily as needed. For severe anxiety attacks 60 tablet 2  . RABEprazole (ACIPHEX) 20 MG tablet Take by mouth.     No current facility-administered medications for this visit.      Musculoskeletal: Strength & Muscle Tone: within normal limits Gait & Station: normal Patient leans: N/A  Psychiatric Specialty Exam: Review of Systems  Psychiatric/Behavioral: Positive for depression. The patient is nervous/anxious.   All other systems reviewed and are negative.   There were no vitals taken for this visit.There is no height or weight on file to calculate BMI.  General Appearance: Casual  Eye Contact:  Fair  Speech:  Clear and Coherent  Volume:  Normal  Mood:  Anxious and Depressed  Affect:  Appropriate  Thought Process:  Goal Directed and Descriptions of Associations: Intact  Orientation:  Full (Time, Place, and Person)  Thought Content: Logical   Suicidal Thoughts:  No  Homicidal Thoughts:  No  Memory:  Immediate;   Fair Recent;   Fair Remote;   Fair  Judgement:  Fair  Insight:  Fair  Psychomotor Activity:  Normal  Concentration:  Concentration: Fair and Attention Span: Fair  Recall:  [employer]Fair  Fund of Knowledge: Fair  Language: Fair  Akathisia:  No  Handed:  Right  AIMS (if indicated): Denies tremors, rigidity, stiffness  Assets:  Communication Skills Desire for Improvement Social Support  ADL's:  Intact  Cognition: WNL  Sleep:  Fair   Screenings:   Assessment and Plan: Madison Morgan is a [age] year old Caucasian female, married, lives in [place], has a history of chronic pain, fatigue, anxiety,  depression, hyperlipidemia, history of EBV infection was evaluated by telemedicine today.  Patient is biologically predisposed given her multiple medical problems, family history as well as history of trauma.  She also has psychosocial stressors of dealing with chronic health problems.  Patient continues to struggle with depressive symptoms as well as panic symptoms and will benefit from both psychotherapy sessions as well as medication management.  Plan MDD-unstable Mirtazapine 45 mg p.o. nightly Discontinue Seroquel since she feels groggy and she reports she sleeps good on just the mirtazapine. Increase Wellbutrin to XL 150 mg p.o. daily Discussed with her to have more frequent therapy sessions [facility]  For GAD- some progress Mirtazapine as prescribed  For panic attacks- unstable Add Propranolol 10 mg p.o. twice daily PRN for severe anxiety attacks Continue CBT  For insomnia- stable She reports she sleeps good on just the mirtazapine. Discontinue Seroquel for side effects  Follow-up in clinic in 4 weeks or sooner if needed.  August 10 at 8:45 AM  I have spent atleast 15 minutes non face to face with patient today. More than 50 % of the time was spent for psychoeducation and supportive psychotherapy and care coordination.  This note was generated in part or whole with voice recognition software. Voice recognition is usually quite accurate but there are transcription errors that can and very often do occur. I apologize for any typographical errors that were not detected and corrected.       Jomarie LongsSaramma Jazzie Trampe, MD 03/22/2019, 12:32 PM

## 2019-03-29 ENCOUNTER — Telehealth: Payer: Self-pay

## 2019-03-29 NOTE — Telephone Encounter (Signed)
received a fax requesting refill on bupropion     buPROPion (WELLBUTRIN XL) 150 MG 24 hr tablet Medication Date: 03/22/2019 Department: [facility] Ordering/Authorizing: Ursula Alert, MD  Order Providers  Prescribing Provider Encounter Provider  Ursula Alert, MD Ursula Alert, MD  Outpatient Medication Detail   Disp Refills Start End   buPROPion (WELLBUTRIN XL) 150 MG 24 hr tablet 15 tablet 1 03/22/2019    Sig - Route: Take 1 tablet (150 mg total) by mouth daily. - Oral   Sent to pharmacy as: buPROPion (WELLBUTRIN XL) 150 MG 24 hr tablet   E-Prescribing Status: Receipt confirmed by pharmacy (03/22/2019 9:05 AM EDT)

## 2019-03-30 ENCOUNTER — Telehealth: Payer: Self-pay | Admitting: Psychiatry

## 2019-03-30 DIAGNOSIS — F331 Major depressive disorder, recurrent, moderate: Secondary | ICD-10-CM

## 2019-03-30 MED ORDER — BUPROPION HCL ER (XL) 150 MG PO TB24: 150.0000 mg | ORAL_TABLET | Freq: Every day | ORAL | 1 refills | Status: DC

## 2019-03-30 NOTE — Telephone Encounter (Signed)
Sent wellbutrin to pharmacy. 

## 2019-04-04 ENCOUNTER — Telehealth: Payer: Self-pay

## 2019-04-04 NOTE — Telephone Encounter (Signed)
left message for patient to call office back.  

## 2019-04-04 NOTE — Telephone Encounter (Signed)
Ask her to stop taking Wellbutrin .  Continue Bupropion as prescribed.  Ask Propranolol prn and monitor her BP.  Call us if she notices worsening of her symptoms.

## 2019-04-04 NOTE — Telephone Encounter (Signed)
pt states that the wellbutrin is making angery and that dr. Shea Evans had wanted her to call if she had any issues. that they could possible to abilify.

## 2019-04-05 NOTE — Telephone Encounter (Signed)
pt called back. pt was given the instructions that dr. Gretel Acre left and pt stated she understood and repeat instruction back. states she would call monday to let us know how she doing.

## 2019-04-12 ENCOUNTER — Telehealth: Payer: Self-pay

## 2019-04-12 DIAGNOSIS — F331 Major depressive disorder, recurrent, moderate: Secondary | ICD-10-CM

## 2019-04-12 NOTE — Telephone Encounter (Signed)
Please advise her that it would take 4-6 weeks for medication to be fully effective. I would advise her to stay on current medication at this time unless she has any side effect.

## 2019-04-12 NOTE — Telephone Encounter (Signed)
pt called states that the wellbutrin is not working and she like to try something else.

## 2019-04-20 DIAGNOSIS — F331 Major depressive disorder, recurrent, moderate: Secondary | ICD-10-CM | POA: Insufficient documentation

## 2019-04-20 MED ORDER — ARIPIPRAZOLE 2 MG PO TABS: 2.0000 mg | ORAL_TABLET | Freq: Every day | ORAL | 0 refills | Status: DC

## 2019-04-20 NOTE — Telephone Encounter (Signed)
pt called states that the wellbutrin is not working for her and she like to try something else.

## 2019-04-20 NOTE — Telephone Encounter (Signed)
Discussed stopping Wellbutrin since she is having side effects - angry, irritable. Start Abilify 2 mg daily.

## 2019-05-02 ENCOUNTER — Encounter: Payer: Self-pay | Admitting: Psychiatry

## 2019-05-02 ENCOUNTER — Ambulatory Visit (INDEPENDENT_AMBULATORY_CARE_PROVIDER_SITE_OTHER): Payer: BC Managed Care – PPO | Admitting: Psychiatry

## 2019-05-02 ENCOUNTER — Other Ambulatory Visit: Payer: Self-pay

## 2019-05-02 DIAGNOSIS — F410 Panic disorder [episodic paroxysmal anxiety] without agoraphobia: Secondary | ICD-10-CM | POA: Insufficient documentation

## 2019-05-02 DIAGNOSIS — F331 Major depressive disorder, recurrent, moderate: Secondary | ICD-10-CM

## 2019-05-02 DIAGNOSIS — F411 Generalized anxiety disorder: Secondary | ICD-10-CM | POA: Diagnosis not present

## 2019-05-02 MED ORDER — ARIPIPRAZOLE 5 MG PO TABS: 5.0000 mg | ORAL_TABLET | Freq: Every day | ORAL | 1 refills | Status: DC

## 2019-05-02 MED ORDER — MIRTAZAPINE 45 MG PO TABS: 45.0000 mg | ORAL_TABLET | Freq: Every day | ORAL | 1 refills | Status: DC

## 2019-05-02 NOTE — Progress Notes (Signed)
Virtual Visit via Video Note  I connected with Madison Morgan on 05/02/19 at  8:45 AM EDT by a video enabled telemedicine application and verified that I am speaking with the correct person using two identifiers.   I discussed the limitations of evaluation and management by telemedicine and the availability of in person appointments. The patient expressed understanding and agreed to proceed.   I discussed the assessment and treatment plan with the patient. The patient was provided an opportunity to ask questions and all were answered. The patient agreed with the plan and demonstrated an understanding of the instructions.   The patient was advised to call back or seek an in-person evaluation if the symptoms worsen or if the condition fails to improve as anticipated.   BH MD OP Progress Note  05/02/2019 3:45 PM [facility]  MRN:  [id number]  Chief Complaint:  Chief Complaint    Follow-up     HPI: Madison BurlyChandra is a 48 year old Caucasian female, lives in [place], has a history of MDD, GAD, panic attacks, insomnia, chronic fatigue, hyperlipidemia was evaluated by telemedicine today.  Patient today reports she has noticed some improvement in her irritability and anger issues since being off of the Wellbutrin.  She reports the Abilify has not caused any side effects.  She is tolerating it well.  She reports mood as improving.  She reports sleep is restless.  She reports she does not tolerate sleep medications well.  She has tried Seroquel and trazodone previously.  She reports she had a hangover effect in the morning on those medications.    Patient reports she is getting used to her work.  She reports work-related stressors are better now.  Patient denies any suicidality, homicidality or perceptual disturbances.  Patient denies any other concerns today.  Visit Diagnosis:    ICD-10-CM   1. MDD (major depressive disorder), recurrent episode, moderate (HCC)  F33.1 ARIPiprazole (ABILIFY) 5  MG tablet    mirtazapine (REMERON) 45 MG tablet  2. GAD (generalized anxiety disorder)  F41.1 mirtazapine (REMERON) 45 MG tablet  3. Panic attacks  F41.0 mirtazapine (REMERON) 45 MG tablet    Past Psychiatric History: I have reviewed past psychiatric history from my progress note on 12/01/2018.  Past trials of Cymbalta, lithium, Prozac, clonazepam, Seroquel, trazodone, Wellbutrin  Past Medical History:  Past Medical History:  Diagnosis Date  . Anxiety   . Chronic back pain   . Depression   . Fibromyalgia   . Hyperlipemia    currently taking lipitor    Past Surgical History:  Procedure Laterality Date  . BREAST BIOPSY Right 2012   negative, stereotactic biopsy  . foot sugery Bilateral     Family Psychiatric History: I have reviewed family psychiatric history from my progress note on 12/01/2018.  Family History:  Family History  Problem Relation Age of Onset  . Depression Father   . Breast cancer Neg Hx     Social History: I have reviewed social history from my progress note on 12/01/2018. Social History   Socioeconomic History  . Marital status: Married    Spouse name: Corporate treasurerchristopher  . Number of children: 2  . Years of education: Not on file  . Highest education level: High school graduate  Occupational History    Comment: full time  Social Needs  . Financial resource strain: Not hard at all  . Food insecurity    Worry: Never true    Inability: Never true  . Transportation needs  Medical: No    Non-medical: No  Tobacco Use  . Smoking status: Current Every Day Smoker    Packs/day: 1.00    Types: Cigarettes  . Smokeless tobacco: Never Used  Substance and Sexual Activity  . Alcohol use: No  . Drug use: No  . Sexual activity: Yes  Lifestyle  . Physical activity    Days per week: 0 days    Minutes per session: 0 min  . Stress: Very much  Relationships  . Social Musicianconnections    Talks on phone: Not on file    Gets together: Not on file    Attends religious  service: Never    Active member of club or organization: No    Attends meetings of clubs or organizations: Never    Relationship status: Married  Other Topics Concern  . Not on file  Social History Narrative  . Not on file    Allergies: No Known Allergies  Metabolic Disorder Labs: No results found for: HGBA1C, MPG No results found for: PROLACTIN No results found for: CHOL, TRIG, HDL, CHOLHDL, VLDL, LDLCALC No results found for: TSH  Therapeutic Level Labs: No results found for: LITHIUM No results found for: VALPROATE No components found for:  CBMZ  Current Medications: Current Outpatient Medications  Medication Sig Dispense Refill  . ARIPiprazole (ABILIFY) 5 MG tablet Take 1 tablet (5 mg total) by mouth daily. 30 tablet 1  . atorvastatin (LIPITOR) 20 MG tablet Take by mouth.    . hydrochlorothiazide (HYDRODIURIL) 25 MG tablet Take by mouth.    Marland Kitchen. HYDROcodone-acetaminophen (NORCO) 10-325 MG tablet Take by mouth.    . mirtazapine (REMERON) 45 MG tablet Take 1 tablet (45 mg total) by mouth at bedtime. 30 tablet 1  . propranolol (INDERAL) 10 MG tablet Take 1 tablet (10 mg total) by mouth 2 (two) times daily as needed. For severe anxiety attacks 60 tablet 2  . RABEprazole (ACIPHEX) 20 MG tablet Take by mouth.    . triamterene-hydrochlorothiazide (MAXZIDE) 75-50 MG tablet Take by mouth.     No current facility-administered medications for this visit.      Musculoskeletal: Strength & Muscle Tone: UTA Gait & Station: normal Patient leans: N/A  Psychiatric Specialty Exam: Review of Systems  Psychiatric/Behavioral: Positive for depression. The patient has insomnia.   All other systems reviewed and are negative.   There were no vitals taken for this visit.There is no height or weight on file to calculate BMI.  General Appearance: Casual  Eye Contact:  Fair  Speech:  Clear and Coherent  Volume:  Normal  Mood:  Depressed  Affect:  Congruent  Thought Process:  Goal Directed  and Descriptions of Associations: Intact  Orientation:  Full (Time, Place, and Person)  Thought Content: Logical   Suicidal Thoughts:  No  Homicidal Thoughts:  No  Memory:  Immediate;   Fair Recent;   Fair Remote;   Fair  Judgement:  Fair  Insight:  Fair  Psychomotor Activity:  Normal  Concentration:  Concentration: Fair and Attention Span: Fair  Recall:  [employer]Fair  Fund of Knowledge: Fair  Language: Fair  Akathisia:  No  Handed:  Right  AIMS (if indicated): denies tremors, rigidity  Assets:  Communication Skills Desire for Improvement Housing Social Support  ADL's:  Intact  Cognition: WNL  Sleep:  Restless   Screenings:   Assessment and Plan: Madison BurlyChandra is a 48 year old Caucasian female, married, lives in [place], has a history of chronic pain, fatigue, anxiety, depression, hyperlipidemia, history  of EBV infection was evaluated by telemedicine today.  Patient is biologically predisposed given her multiple medical problems, family history as well as history of trauma.  She also has psychosocial stressors of dealing with chronic health problems.  Patient is currently making progress on the current medication regimen although continues to struggle with sleep.  Plan as noted below.  Plan MDD- some progress Mirtazapine 45 mg p.o. nightly Discontinue Wellbutrin for side effects Increase Abilify to 5 mg p.o. daily Continue psychotherapy sessions with her therapist Benita [facility].  For GAD-improving Mirtazapine as prescribed  For panic attacks-improving Propranolol 10 mg p.o. twice daily PRN for severe anxiety attacks Mirtazapine 45 mg p.o. nightly  For insomnia-unstable Discussed adding medications like Sonata.  She declined.  She reports she does not want anything habit-forming. Discussed melatonin.  She will try it over-the-counter.  Follow-up in clinic in 4 weeks or sooner if needed.  September 14 at 4:30 PM  I have spent atleast 15 minutes non face to  face with patient today. More than 50 % of the time was spent for psychoeducation and supportive psychotherapy and care coordination.  This note was generated in part or whole with voice recognition software. Voice recognition is usually quite accurate but there are transcription errors that can and very often do occur. I apologize for any typographical errors that were not detected and corrected.        Ursula Alert, MD 05/02/2019, 3:45 PM

## 2019-05-05 ENCOUNTER — Other Ambulatory Visit: Payer: Self-pay

## 2019-05-05 ENCOUNTER — Ambulatory Visit (INDEPENDENT_AMBULATORY_CARE_PROVIDER_SITE_OTHER): Payer: BC Managed Care – PPO | Admitting: Vascular Surgery

## 2019-05-05 ENCOUNTER — Encounter (INDEPENDENT_AMBULATORY_CARE_PROVIDER_SITE_OTHER): Payer: Self-pay | Admitting: Vascular Surgery

## 2019-05-05 VITALS — BP 134/85 | HR 101 | Resp 10 | Ht 66.0 in | Wt 237.0 lb

## 2019-05-05 DIAGNOSIS — M79605 Pain in left leg: Secondary | ICD-10-CM

## 2019-05-05 DIAGNOSIS — E782 Mixed hyperlipidemia: Secondary | ICD-10-CM | POA: Diagnosis not present

## 2019-05-05 DIAGNOSIS — I890 Lymphedema, not elsewhere classified: Secondary | ICD-10-CM | POA: Insufficient documentation

## 2019-05-05 DIAGNOSIS — J449 Chronic obstructive pulmonary disease, unspecified: Secondary | ICD-10-CM | POA: Diagnosis not present

## 2019-05-05 DIAGNOSIS — M79604 Pain in right leg: Secondary | ICD-10-CM

## 2019-05-05 DIAGNOSIS — M79606 Pain in leg, unspecified: Secondary | ICD-10-CM | POA: Insufficient documentation

## 2019-05-05 NOTE — Progress Notes (Signed)
MRN : [id number]  Ronnell FreshwaterChandra S Morgan is a 48 y.o. ([birth year]) female who presents with chief complaint of  Chief Complaint  Patient presents with  . New Patient (Initial Visit)  .  History of Present Illness:   Patient is seen for evaluation of leg pain and leg swelling. The patient first noticed the swelling remotely. The swelling is associated with pain and discoloration. The pain and swelling worsens with prolonged dependency and improves with elevation. The pain is unrelated to activity.  The patient notes that in the morning the legs are significantly improved but they steadily worsened throughout the course of the day. The patient also notes a steady worsening of the discoloration in the ankle and shin area.   The patient denies claudication symptoms.  The patient denies symptoms consistent with rest pain.  The patient denies and extensive history of DJD and LS spine disease.  The patient has no had any past angiography, interventions or vascular surgery.  Elevation makes the leg symptoms better, dependency makes them much worse. There is no history of ulcerations. The patient denies any recent changes in medications.  The patient has not been wearing graduated compression.  The patient denies a history of DVT or PE. There is no prior history of phlebitis. There is no history of primary lymphedema.  No history of malignancies. No history of trauma or groin or pelvic surgery. There is no history of radiation treatment to the groin or pelvis  The patient denies amaurosis fugax or recent TIA symptoms. There are no recent neurological changes noted. The patient denies recent episodes of angina or shortness of breath   Current Meds  Medication Sig  . ARIPiprazole (ABILIFY) 5 MG tablet Take 1 tablet (5 mg total) by mouth daily.  Marland Kitchen. atorvastatin (LIPITOR) 20 MG tablet Take by mouth.  Marland Kitchen. HYDROcodone-acetaminophen (NORCO) 10-325 MG tablet Take by mouth.  . mirtazapine (REMERON) 45 MG  tablet Take 1 tablet (45 mg total) by mouth at bedtime.  . propranolol (INDERAL) 10 MG tablet Take 1 tablet (10 mg total) by mouth 2 (two) times daily as needed. For severe anxiety attacks  . triamterene-hydrochlorothiazide (MAXZIDE) 75-50 MG tablet Take by mouth.    Past Medical History:  Diagnosis Date  . Anxiety   . Chronic back pain   . Depression   . Fibromyalgia   . Hyperlipemia    currently taking lipitor    Past Surgical History:  Procedure Laterality Date  . BREAST BIOPSY Right 2012   negative, stereotactic biopsy  . foot sugery Bilateral     Social History Social History   Tobacco Use  . Smoking status: Current Every Day Smoker    Packs/day: 1.00    Types: Cigarettes  . Smokeless tobacco: Never Used  Substance Use Topics  . Alcohol use: No  . Drug use: No    Family History Family History  Problem Relation Age of Onset  . Depression Father   . Breast cancer Neg Hx   No family history of bleeding/clotting disorders, porphyria or autoimmune disease   No Known Allergies   REVIEW OF SYSTEMS (Negative unless checked)  Constitutional: [] Weight loss  [] Fever  [] Chills Cardiac: [] Chest pain   [] Chest pressure   [] Palpitations   [] Shortness of breath when laying flat   [] Shortness of breath with exertion. Vascular:  [] Pain in legs with walking   [x] Pain in legs at rest  [] History of DVT   [] Phlebitis   [x] Swelling in legs   [] Varicose  veins   [] Non-healing ulcers Pulmonary:   [] Uses home oxygen   [] Productive cough   [] Hemoptysis   [] Wheeze  [] COPD   [] Asthma Neurologic:  [] Dizziness   [] Seizures   [] History of stroke   [] History of TIA  [] Aphasia   [] Vissual changes   [] Weakness or numbness in arm   [] Weakness or numbness in leg Musculoskeletal:   [] Joint swelling   [x] Joint pain   [] Low back pain Hematologic:  [] Easy bruising  [] Easy bleeding   [] Hypercoagulable state   [] Anemic Gastrointestinal:  [] Diarrhea   [] Vomiting  [] Gastroesophageal reflux/heartburn    [] Difficulty swallowing. Genitourinary:  [] Chronic kidney disease   [] Difficult urination  [] Frequent urination   [] Blood in urine Skin:  [] Rashes   [] Ulcers  Psychological:  [] History of anxiety   []  History of major depression.  Physical Examination  Vitals:   05/05/19 1440  BP: 134/85  Pulse: (!) 101  Resp: 10  Weight: 237 lb (107.5 kg)  Height: 5\' 6"  (1.676 m)   Body mass index is 38.25 kg/m. Gen: WD/WN, NAD Head: Rafael Gonzalez/AT, No temporalis wasting.  Ear/Nose/Throat: Hearing grossly intact, nares w/o erythema or drainage, poor dentition Eyes: PER, EOMI, sclera nonicteric.  Neck: Supple, no masses.  No bruit or JVD.  Pulmonary:  Good air movement, clear to auscultation bilaterally, no use of accessory muscles.  Cardiac: RRR, normal S1, S2, no Murmurs. Vascular: scattered varicosities present bilaterally.  Mild venous stasis changes to the legs bilaterally.  3-4+ soft pitting edema Vessel Right Left  PT Palpable Palpable  DP Palpable Palpable  Gastrointestinal: soft, non-distended. No guarding/no peritoneal signs.  Musculoskeletal: M/S 5/5 throughout.  No deformity or atrophy.  Neurologic: CN 2-12 intact. Pain and light touch intact in extremities.  Symmetrical.  Speech is fluent. Motor exam as listed above. Psychiatric: Judgment intact, Mood & affect appropriate for pt's clinical situation. Dermatologic: mild venous rashes no ulcers noted.  No changes consistent with cellulitis. Lymph : No Cervical lymphadenopathy, no lichenification or skin changes of chronic lymphedema.  CBC No results found for: WBC, HGB, HCT, MCV, PLT  BMET No results found for: NA, K, CL, CO2, GLUCOSE, BUN, CREATININE, CALCIUM, GFRNONAA, GFRAA CrCl cannot be calculated (No successful lab value found.).  COAG No results found for: INR, PROTIME  Radiology No results found.  Assessment/Plan 1. Lymphedema I have had a long discussion with the patient regarding swelling and why it  causes symptoms.   Patient will begin wearing graduated compression stockings class 1 (20-30 mmHg) on a daily basis a prescription was given. The patient will  beginning wearing the stockings first thing in the morning and removing them in the evening. The patient is instructed specifically not to sleep in the stockings.   In addition, behavioral modification will be initiated.  This will include frequent elevation, use of over the counter pain medications and exercise such as walking.  I have reviewed systemic causes for chronic edema such as liver, kidney and cardiac etiologies.  The patient denies problems with these organ systems.    Consideration for a lymph pump will also be made based upon the effectiveness of conservative therapy.  This would help to improve the edema control and prevent sequela such as ulcers and infections   Patient should undergo duplex ultrasound of the venous system to ensure that DVT or reflux is not present.  The patient will follow-up with me after the ultrasound.   - VAS US LOWER EXTREMITY VENOUS (DVT); Future  2. Pain in both lower  extremities I have had a long discussion with the patient regarding swelling and why it  causes symptoms.  Patient will begin wearing graduated compression stockings class 1 (20-30 mmHg) on a daily basis a prescription was given. The patient will  beginning wearing the stockings first thing in the morning and removing them in the evening. The patient is instructed specifically not to sleep in the stockings.   In addition, behavioral modification will be initiated.  This will include frequent elevation, use of over the counter pain medications and exercise such as walking.  I have reviewed systemic causes for chronic edema such as liver, kidney and cardiac etiologies.  The patient denies problems with these organ systems.    Consideration for a lymph pump will also be made based upon the effectiveness of conservative therapy.  This would help to improve  the edema control and prevent sequela such as ulcers and infections   Patient should undergo duplex ultrasound of the venous system to ensure that DVT or reflux is not present.  The patient will follow-up with me after the ultrasound.   - CT Abdomen Pelvis W Contrast; Future - VAS [place] LOWER EXTREMITY VENOUS (DVT); Future  3. Chronic obstructive pulmonary disease, unspecified COPD type ([place]) Continue pulmonary medications and aerosols as already ordered, these medications have been reviewed and there are no changes at this time.   4. Mixed hyperlipidemia Continue statin as ordered and reviewed, no changes at this time    Hortencia Pilar, MD  05/05/2019 5:26 PM

## 2019-06-06 ENCOUNTER — Ambulatory Visit (INDEPENDENT_AMBULATORY_CARE_PROVIDER_SITE_OTHER): Payer: BC Managed Care – PPO | Admitting: Psychiatry

## 2019-06-06 ENCOUNTER — Other Ambulatory Visit: Payer: Self-pay

## 2019-06-06 ENCOUNTER — Encounter: Payer: Self-pay | Admitting: Psychiatry

## 2019-06-06 DIAGNOSIS — F17200 Nicotine dependence, unspecified, uncomplicated: Secondary | ICD-10-CM

## 2019-06-06 DIAGNOSIS — F410 Panic disorder [episodic paroxysmal anxiety] without agoraphobia: Secondary | ICD-10-CM

## 2019-06-06 DIAGNOSIS — F331 Major depressive disorder, recurrent, moderate: Secondary | ICD-10-CM | POA: Diagnosis not present

## 2019-06-06 DIAGNOSIS — F5105 Insomnia due to other mental disorder: Secondary | ICD-10-CM | POA: Insufficient documentation

## 2019-06-06 DIAGNOSIS — F411 Generalized anxiety disorder: Secondary | ICD-10-CM

## 2019-06-06 MED ORDER — BREXPIPRAZOLE 0.25 MG PO TABS: 0.2500 mg | ORAL_TABLET | Freq: Every day | ORAL | 1 refills | Status: DC

## 2019-06-06 NOTE — Progress Notes (Signed)
Patient cancelled

## 2019-06-06 NOTE — Progress Notes (Signed)
Virtual Visit via Video Note  I connected with Madison Morgan on 06/06/19 at  4:30 PM EDT by a video enabled telemedicine application and verified that I am speaking with the correct person using two identifiers.   I discussed the limitations of evaluation and management by telemedicine and the availability of in person appointments. The patient expressed understanding and agreed to proceed.   I discussed the assessment and treatment plan with the patient. The patient was provided an opportunity to ask questions and all were answered. The patient agreed with the plan and demonstrated an understanding of the instructions.   The patient was advised to call back or seek an in-person evaluation if the symptoms worsen or if the condition fails to improve as anticipated.   BH MD OP Progress Note  06/06/2019 4:53 PM [facility] S [facility]  MRN:  [id number]  Chief Complaint:  Chief Complaint    Follow-up     HPI: Madison BurlyChandra is a 48 year old Caucasian female, lives in [place], has a history of MDD, GAD, panic attacks, insomnia, chronic fatigue, hyperlipidemia was evaluated by telemedicine today.  Patient today reports she is unable to take the Abilify due to side effects.  She reports she feels this restlessness when she takes the Abilify and is unable to sit still.  However the Abilify at lower dose it did help with her mood.  She however reports because of her restlessness she had to stop taking it.  Patient continues to report sleep is restless.  She also reports mood lability and depressive symptoms when not on Abilify.  Discussed starting Rexulti.  She agrees with plan.  She however reports she does not want any sleep medication or anything that can make her drowsy at bedtime since her daughter is going to have a baby soon and she wants to be alert and awake if her daughter calls her in the middle of the night.  Patient denies any suicidality, homicidality or perceptual disturbances.  Patient  denies any other concerns today. Visit Diagnosis:    ICD-10-CM   1. MDD (major depressive disorder), recurrent episode, moderate (HCC)  F33.1 brexpiprazole (REXULTI) TABS tablet  2. GAD (generalized anxiety disorder)  F41.1   3. Panic attacks  F41.0   4. Tobacco use disorder  F17.200     Past Psychiatric History: I have reviewed past psychiatric history from my progress note on 12/01/2018.  Past trials of Cymbalta, lithium, Prozac, Klonopin, Seroquel, trazodone, Wellbutrin, Abilify  Past Medical History:  Past Medical History:  Diagnosis Date  . Anxiety   . Chronic back pain   . Depression   . Fibromyalgia   . Hyperlipemia    currently taking lipitor    Past Surgical History:  Procedure Laterality Date  . BREAST BIOPSY Right 2012   negative, stereotactic biopsy  . foot sugery Bilateral     Family Psychiatric History: I have reviewed family psychiatric history from my progress note on 12/01/2018  Family History:  Family History  Problem Relation Age of Onset  . Depression Father   . Breast cancer Neg Hx     Social History: I have reviewed social history from my progress note on 12/01/2018 Social History   Socioeconomic History  . Marital status: Married    Spouse name: Corporate treasurerchristopher  . Number of children: 2  . Years of education: Not on file  . Highest education level: High school graduate  Occupational History    Comment: full time  Social Needs  . Physicist, medicalinancial resource  strain: Not hard at all  . Food insecurity    Worry: Never true    Inability: Never true  . Transportation needs    Medical: No    Non-medical: No  Tobacco Use  . Smoking status: Current Every Day Smoker    Packs/day: 1.00    Types: Cigarettes  . Smokeless tobacco: Never Used  Substance and Sexual Activity  . Alcohol use: No  . Drug use: No  . Sexual activity: Yes  Lifestyle  . Physical activity    Days per week: 0 days    Minutes per session: 0 min  . Stress: Very much  Relationships  .  Social Musician on phone: Not on file    Gets together: Not on file    Attends religious service: Never    Active member of club or organization: No    Attends meetings of clubs or organizations: Never    Relationship status: Married  Other Topics Concern  . Not on file  Social History Narrative  . Not on file    Allergies: No Known Allergies  Metabolic Disorder Labs: No results found for: HGBA1C, MPG No results found for: PROLACTIN No results found for: CHOL, TRIG, HDL, CHOLHDL, VLDL, LDLCALC No results found for: TSH  Therapeutic Level Labs: No results found for: LITHIUM No results found for: VALPROATE No components found for:  CBMZ  Current Medications: Current Outpatient Medications  Medication Sig Dispense Refill  . atorvastatin (LIPITOR) 20 MG tablet Take by mouth.    . brexpiprazole (REXULTI) TABS tablet Take 1 tablet (0.25 mg total) by mouth at bedtime. 30 tablet 1  . hydrochlorothiazide (HYDRODIURIL) 25 MG tablet Take by mouth.    Marland Kitchen HYDROcodone-acetaminophen (NORCO) 10-325 MG tablet Take by mouth.    . mirtazapine (REMERON) 45 MG tablet Take 1 tablet (45 mg total) by mouth at bedtime. 30 tablet 1  . propranolol (INDERAL) 10 MG tablet Take 1 tablet (10 mg total) by mouth 2 (two) times daily as needed. For severe anxiety attacks 60 tablet 2  . RABEprazole (ACIPHEX) 20 MG tablet Take by mouth.    . triamterene-hydrochlorothiazide (MAXZIDE) 75-50 MG tablet Take by mouth.     No current facility-administered medications for this visit.      Musculoskeletal: Strength & Muscle Tone: UTA Gait & Station: Observed as seated Patient leans: N/A  Psychiatric Specialty Exam: Review of Systems  Psychiatric/Behavioral: Positive for depression. The patient has insomnia.   All other systems reviewed and are negative.   There were no vitals taken for this visit.There is no height or weight on file to calculate BMI.  General Appearance: Casual  Eye Contact:   Fair  Speech:  Clear and Coherent  Volume:  Normal  Mood:  Euthymic does have on and off depression  Affect:  Congruent  Thought Process:  Goal Directed and Descriptions of Associations: Intact  Orientation:  Full (Time, Place, and Person)  Thought Content: Logical   Suicidal Thoughts:  No  Homicidal Thoughts:  No  Memory:  Immediate;   Fair Recent;   Fair Remote;   Fair  Judgement:  Fair  Insight:  Fair  Psychomotor Activity:  Normal  Concentration:  Concentration: Fair and Attention Span: Fair  Recall:  [employer] of Knowledge: Fair  Language: Fair  Akathisia:  No  Handed:  Right  AIMS (if indicated): Reports Abilify makes her restless  Assets:  Communication Skills Desire for Improvement Social Support  ADL's:  Intact  Cognition: WNL  Sleep:  Poor   Screenings:   Assessment and Plan: Novis is a 48 year old Caucasian female, married, lives in [place], has a history of chronic pain, fatigue, depression, anxiety, hyperlipidemia, history of EBV infection was evaluated by telemedicine today.  Patient is biologically predisposed given her multiple medical problems, family history as well as history of trauma.  She continues to struggle with adverse side effects to Abilify as well as mood and sleep problems.  Plan MDD-unstable Mirtazapine 45 mg p.o. nightly Discontinue Abilify for side effects Start Rexulti 0.5 mg p.o. nightly Continue psychotherapy sessions with therapist Benita Gutter at [facility].  For GAD-improving Mirtazapine as prescribed  Panic attacks-improving Propranolol 10 mg p.o. twice daily PRN for severe anxiety attacks Mirtazapine 45 mg p.o. nightly  Insomnia- unstable Patient is currently declining medications.  For tobacco use disorder-provided smoking cessation counseling.  Follow-up in clinic in 4 weeks or sooner if needed.  October 28 at 4:30 PM  I have spent atleast 15 minutes non face to face with patient today. More than 50 %  of the time was spent for psychoeducation and supportive psychotherapy and care coordination. This note was generated in part or whole with voice recognition software. Voice recognition is usually quite accurate but there are transcription errors that can and very often do occur. I apologize for any typographical errors that were not detected and corrected.       Ursula Alert, MD 06/06/2019, 4:53 PM

## 2019-06-06 NOTE — Patient Instructions (Signed)
Brexpiprazole oral tablets What is this medicine? BREXPIPRAZOLE (brex PIP ray zole) is an antipsychotic. It is used to treat schizophrenia. This medicine is also used in combination with antidepressants to treat major depressive disorder. This medicine may be used for other purposes; ask your health care provider or pharmacist if you have questions. COMMON BRAND NAME(S): REXULTI What should I tell my health care provider before I take this medicine? They need to know if you have any of these conditions:  dementia  diabetes  difficulty swallowing  heart disease  high cholesterol or high levels of triglycerides in the blood  history of stroke  kidney disease  liver disease  low blood counts, like low white cell, platelet, or red cell counts  Parkinson's disease  seizures  suicidal thoughts, plans, or attempt; a previous suicide attempt by you or a family member  an unusual or allergic reaction to bexpiprazole, other medicines, foods, dyes, or preservatives  pregnant or trying to get pregnant  breast-feeding How should I use this medicine? Take this medicine by mouth with a glass of water. Follow the directions on the prescription label. You can take this medicine with or without food. Take your doses at regular intervals. Do not take your medicine more often than directed. Do not stop taking except on the advice of your doctor or health care professional. A special MedGuide will be given to you by the pharmacist with each prescription and refill. Be sure to read this information carefully each time. Talk to your pediatrician regarding the use of this medicine in children. Special care may be needed. Overdosage: If you think you have taken too much of this medicine contact a poison control center or emergency room at once. NOTE: This medicine is only for you. Do not share this medicine with others. What if I miss a dose? If you miss a dose, take it as soon as you can. If it  is almost time for your next dose, take only that dose. Do not take double or extra doses. What may interact with this medicine? Do not take this medicine with any of the following medications:  aripiprazole  metoclopramide This medicine may also interact with the following medications:  clarithromycin  certain medicines for blood pressure  certain medicines for fungal infections like fluconazole, itraconazole, ketoconazole  duloxetine  fluoxetine  paroxetine  quinidine  rifampin  St. John's Wort This list may not describe all possible interactions. Give your health care provider a list of all the medicines, herbs, non-prescription drugs, or dietary supplements you use. Also tell them if you smoke, drink alcohol, or use illegal drugs. Some items may interact with your medicine. What should I watch for while using this medicine? Visit your doctor or health care professional for regular checks on your progress. It may be several weeks before you see the full effects of this medicine. Notify your doctor or health care professional if your symptoms get worse, if you have new symptoms, if you are having an unusual effect from this medicine, or if you feel out of control, very discouraged or think you might harm yourself or others. Do not suddenly stop taking this medicine. You may need to gradually reduce the dose. Ask your doctor or health care professional for advice. You may get dizzy or drowsy. Do not drive, use machinery, or do anything that needs mental alertness until you know how this medicine affects you. Do not stand or sit up quickly, especially if you are an older  patient. This reduces the risk of dizzy or fainting spells. Alcohol can increase dizziness and drowsiness. Avoid alcoholic drinks. There have been reports of uncontrollable and strong urges to gamble, binge eat, shop, and have sex while taking this medicine. If you experience any of these or other uncontrollable and  strong urges while taking this medicine, you should report it to your health care provider as soon as possible. This medicine may cause dry eyes and blurred vision. If you wear contact lenses you may feel some discomfort. Lubricating drops may help. See your eye doctor if the problem does not go away or is severe. This medicine may increase blood sugar. Ask your healthcare provider if changes in diet or medicines are needed if you have diabetes. This medicine can reduce the response of your body to heat or cold. Dress warm in cold weather and stay hydrated in hot weather. If possible, avoid extreme temperatures like saunas, hot tubs, very hot or cold showers, or activities that can cause dehydration such as vigorous exercise. What side effects may I notice from receiving this medicine? Side effects that you should report to your doctor or health care professional as soon as possible:  allergic reactions like skin rash, itching or hives, swelling of the face, lips, or tongue  breathing problems  confusion  feeling faint or lightheaded, falls  fever or chills, sore throat  problems with balance, talking, walking  restlessness or need to keep moving  seizures  signs and symptoms of high blood sugar such as being more thirsty or hungry or having to urinate more than normal. You may also feel very tired or have blurry vision.  suicidal thoughts or other mood changes  trouble swallowing  uncontrollable and excessive urges (examples: gambling, binge eating, shopping, having sex)  uncontrollable head, mouth, neck, arm, or leg movements  unusually weak or tired Side effects that usually do not require medical attention (report to your doctor or health care professional if they continue or are bothersome):  constipation  drowsiness  headache  stomach upset  weight gain This list may not describe all possible side effects. Call your doctor for medical advice about side effects. You  may report side effects to FDA at 1-800-FDA-1088. Where should I keep my medicine? Keep out of the reach of children. Store at room temperature between 15 and 30 degrees C (59 and 86 degrees F). Throw away any unused medicine after the expiration date. NOTE: This sheet is a summary. It may not cover all possible information. If you have questions about this medicine, talk to your doctor, pharmacist, or health care provider.  2020 Elsevier/Gold Standard (2018-06-30 16:45:02)

## 2019-06-07 ENCOUNTER — Telehealth: Payer: Self-pay

## 2019-06-07 NOTE — Telephone Encounter (Signed)
covermymeds.[email or web address] notified of approval [facility]. rexultii .25mg  approved from  06-07-19 to  06-06-22

## 2019-06-07 NOTE — Telephone Encounter (Signed)
received a fax requesting a prior authorization for rexulti

## 2019-06-07 NOTE — Telephone Encounter (Signed)
prior authorization was submit and is pending.

## 2019-06-16 DIAGNOSIS — E538 Deficiency of other specified B group vitamins: Secondary | ICD-10-CM | POA: Insufficient documentation

## 2019-06-16 DIAGNOSIS — E559 Vitamin D deficiency, unspecified: Secondary | ICD-10-CM | POA: Insufficient documentation

## 2019-06-16 DIAGNOSIS — R7303 Prediabetes: Secondary | ICD-10-CM | POA: Insufficient documentation

## 2019-06-24 DIAGNOSIS — R0602 Shortness of breath: Secondary | ICD-10-CM | POA: Insufficient documentation

## 2019-06-24 DIAGNOSIS — R600 Localized edema: Secondary | ICD-10-CM | POA: Insufficient documentation

## 2019-07-04 ENCOUNTER — Telehealth: Payer: Self-pay

## 2019-07-04 NOTE — Telephone Encounter (Signed)
  pt left a message that she can not afford the medication you prescribed. is there anything cheaper

## 2019-07-04 NOTE — Telephone Encounter (Signed)
Returned call to patient.  Left voicemail with her that she can be started on something cheaper.  Advised her to call us back.

## 2019-07-06 ENCOUNTER — Telehealth: Payer: Self-pay

## 2019-07-06 DIAGNOSIS — F331 Major depressive disorder, recurrent, moderate: Secondary | ICD-10-CM

## 2019-07-06 DIAGNOSIS — F411 Generalized anxiety disorder: Secondary | ICD-10-CM

## 2019-07-06 MED ORDER — LURASIDONE HCL 20 MG PO TABS: 20.0000 mg | ORAL_TABLET | Freq: Every day | ORAL | 1 refills | Status: DC

## 2019-07-06 NOTE — Telephone Encounter (Signed)
Sent latuda to pharmacy

## 2019-07-06 NOTE — Telephone Encounter (Signed)
Return call to patient.  Left message about Latuda.

## 2019-07-06 NOTE — Telephone Encounter (Signed)
pt called left a message that she is returning dr. Shea Evans messages.

## 2019-07-06 NOTE — Telephone Encounter (Signed)
pt called states that she would like to try the latuda

## 2019-07-07 ENCOUNTER — Ambulatory Visit (INDEPENDENT_AMBULATORY_CARE_PROVIDER_SITE_OTHER): Payer: BC Managed Care – PPO | Admitting: Vascular Surgery

## 2019-07-07 ENCOUNTER — Encounter (INDEPENDENT_AMBULATORY_CARE_PROVIDER_SITE_OTHER): Payer: BC Managed Care – PPO

## 2019-07-07 NOTE — Telephone Encounter (Signed)
received a prior auth request

## 2019-07-07 NOTE — Telephone Encounter (Signed)
pa was submitted and approved from  07-07-19 to 07-06-2022

## 2019-07-07 NOTE — Telephone Encounter (Signed)
pharmacy was faxed and confirmed the approval notice.

## 2019-07-20 ENCOUNTER — Ambulatory Visit: Payer: BC Managed Care – PPO | Admitting: Psychiatry

## 2019-07-28 ENCOUNTER — Ambulatory Visit (INDEPENDENT_AMBULATORY_CARE_PROVIDER_SITE_OTHER): Payer: BC Managed Care – PPO

## 2019-07-28 ENCOUNTER — Encounter (INDEPENDENT_AMBULATORY_CARE_PROVIDER_SITE_OTHER): Payer: Self-pay | Admitting: Vascular Surgery

## 2019-07-28 ENCOUNTER — Other Ambulatory Visit: Payer: Self-pay

## 2019-07-28 ENCOUNTER — Ambulatory Visit (INDEPENDENT_AMBULATORY_CARE_PROVIDER_SITE_OTHER): Payer: BC Managed Care – PPO | Admitting: Vascular Surgery

## 2019-07-28 VITALS — BP 135/84 | HR 91 | Resp 18 | Ht 64.5 in | Wt 245.0 lb

## 2019-07-28 DIAGNOSIS — M79605 Pain in left leg: Secondary | ICD-10-CM | POA: Diagnosis not present

## 2019-07-28 DIAGNOSIS — M79604 Pain in right leg: Secondary | ICD-10-CM

## 2019-07-28 DIAGNOSIS — I890 Lymphedema, not elsewhere classified: Secondary | ICD-10-CM

## 2019-07-28 DIAGNOSIS — J449 Chronic obstructive pulmonary disease, unspecified: Secondary | ICD-10-CM

## 2019-07-28 DIAGNOSIS — E782 Mixed hyperlipidemia: Secondary | ICD-10-CM | POA: Diagnosis not present

## 2019-07-28 NOTE — Progress Notes (Signed)
MRN : [id number]  Madison Morgan is a 48 y.o. (Jul 26, 1971) female who presents with chief complaint of  Chief Complaint  Patient presents with   Follow-up  .  History of Present Illness:   The patient returns to the office for followup evaluation regarding leg swelling.  The swelling has persisted and the pain associated with swelling continues. There have not been any interval development of a ulcerations or wounds.  Since the previous visit the patient has been wearing graduated compression stockings and has noted little if any improvement in the lymphedema. The patient has been using compression routinely morning until night.  The patient also states elevation during the day and exercise is being done too.  Duplex ultrasound of the venous system shows patent deep system and superficial system no reflux identified.  Current Meds  Medication Sig   atorvastatin (LIPITOR) 20 MG tablet Take by mouth.   Cholecalciferol 100 MCG (4000 UT) TABS Take by mouth.   HYDROcodone-acetaminophen (NORCO) 10-325 MG tablet Take by mouth.   lamoTRIgine (LAMICTAL) 25 MG tablet    propranolol (INDERAL) 10 MG tablet Take 1 tablet (10 mg total) by mouth 2 (two) times daily as needed. For severe anxiety attacks   RABEprazole (ACIPHEX) 20 MG tablet Take by mouth.   sertraline (ZOLOFT) 50 MG tablet Take 50 mg by mouth daily.   triamterene-hydrochlorothiazide (MAXZIDE) 75-50 MG tablet Take by mouth.    Past Medical History:  Diagnosis Date   Anxiety    Chronic back pain    Depression    Fibromyalgia    Hyperlipemia    currently taking lipitor    Past Surgical History:  Procedure Laterality Date   BREAST BIOPSY Right 2012   negative, stereotactic biopsy   foot sugery Bilateral     Social History Social History   Tobacco Use   Smoking status: Current Every Day Smoker    Packs/day: 1.00    Types: Cigarettes   Smokeless tobacco: Never Used  Substance Use Topics    Alcohol use: No   Drug use: No    Family History Family History  Problem Relation Age of Onset   Depression Father    Breast cancer Neg Hx     No Known Allergies   REVIEW OF SYSTEMS (Negative unless checked)  Constitutional: Weight loss  Fever  Chills Cardiac: Chest pain   Chest pressure   Palpitations   Shortness of breath when laying flat   Shortness of breath with exertion. Vascular:  Pain in legs with walking   Pain in legs at rest  History of DVT   Phlebitis   Swelling in legs   Varicose veins   Non-healing ulcers Pulmonary:   Uses home oxygen   Productive cough   Hemoptysis   Wheeze  COPD   Asthma Neurologic:  Dizziness   Seizures   History of stroke   History of TIA  Aphasia   Vissual changes   Weakness or numbness in arm   Weakness or numbness in leg Musculoskeletal:   Joint swelling   Joint pain   Low back pain Hematologic:  Easy bruising  Easy bleeding   Hypercoagulable state   Anemic Gastrointestinal:  Diarrhea   Vomiting  Gastroesophageal reflux/heartburn   Difficulty swallowing. Genitourinary:  Chronic kidney disease   Difficult urination  Frequent urination   Blood in urine Skin:  Rashes   Ulcers  Psychological:  History of anxiety    History of major depression.  Physical Examination  Vitals:   07/28/19 1601  BP: 135/84  Pulse: 91  Resp: 18  Weight: 245 lb (111.1 kg)  Height: 5' 4.5" (1.638 m)   Body mass index is 41.4 kg/m. Gen: WD/WN, NAD Head: [place]/AT, No temporalis wasting.  Ear/Nose/Throat: Hearing grossly intact, nares w/o erythema or drainage Eyes: PER, EOMI, sclera nonicteric.  Neck: Supple, no large masses.   Pulmonary:  Good air movement, no audible wheezing bilaterally, no use of accessory muscles.  Cardiac: RRR, no JVD Vascular: scattered varicosities present bilaterally.  Mild venous stasis changes to the legs bilaterally.  4+ soft pitting  edema Gastrointestinal: Non-distended. No guarding/no peritoneal signs.  Musculoskeletal: M/S 5/5 throughout.  No deformity or atrophy.  Neurologic: CN 2-12 intact. Symmetrical.  Speech is fluent. Motor exam as listed above. Psychiatric: Judgment intact, Mood & affect appropriate for pt's clinical situation. Dermatologic: No rashes or ulcers noted.  No changes consistent with cellulitis. Lymph : No lichenification or skin changes of chronic lymphedema.  CBC No results found for: WBC, HGB, HCT, MCV, PLT  BMET No results found for: NA, K, CL, CO2, GLUCOSE, BUN, CREATININE, CALCIUM, GFRNONAA, GFRAA CrCl cannot be calculated (No successful lab value found.).  COAG No results found for: INR, PROTIME  Radiology Vas [place] Lower Extremity Venous Reflux  Result Date: 07/28/2019  Lower Venous Reflux Study Indications: Bilateral heaviness in legs.  Performing Technologist: Reece Agar RT (R)(VS)  Examination Guidelines: A complete evaluation includes B-mode imaging, spectral Doppler, color Doppler, and power Doppler as needed of all accessible portions of each vessel. Bilateral testing is considered an integral part of a complete examination. Limited examinations for reoccurring indications may be performed as noted. The reflux portion of the exam is performed with the patient in reverse Trendelenburg.  +---------+---------------+---------+-----------+----------+--------------+  RIGHT     Compressibility Phasicity Spontaneity Properties Thrombus Aging  +---------+---------------+---------+-----------+----------+--------------+  CFV       Full                                                             +---------+---------------+---------+-----------+----------+--------------+  SFJ       Full                                                             +---------+---------------+---------+-----------+----------+--------------+  FV Prox   Full                                                              +---------+---------------+---------+-----------+----------+--------------+  FV Mid    Full                                                             +---------+---------------+---------+-----------+----------+--------------+  FV Distal Full                                                             +---------+---------------+---------+-----------+----------+--------------+  POP       Full                                                             +---------+---------------+---------+-----------+----------+--------------+  GSV       Full                                                             +---------+---------------+---------+-----------+----------+--------------+  SSV       Full                                                             +---------+---------------+---------+-----------+----------+--------------+  +---------+---------------+---------+-----------+----------+--------------+  LEFT      Compressibility Phasicity Spontaneity Properties Thrombus Aging  +---------+---------------+---------+-----------+----------+--------------+  CFV       Full                                                             +---------+---------------+---------+-----------+----------+--------------+  SFJ       Full                                                             +---------+---------------+---------+-----------+----------+--------------+  FV Prox   Full                                                             +---------+---------------+---------+-----------+----------+--------------+  FV Mid    Full                                                             +---------+---------------+---------+-----------+----------+--------------+  FV Distal Full                                                             +---------+---------------+---------+-----------+----------+--------------+  POP       Full                                                              +---------+---------------+---------+-----------+----------+--------------+  GSV       Full                                                             +---------+---------------+---------+-----------+----------+--------------+  SSV       Full                                                             +---------+---------------+---------+-----------+----------+--------------+ Venous Reflux Times Normal value < 0.5 sec +--------------+------+---------+--------+--------+  RIGHT          Reflux Reflux No Diameter Comments                   Yes                                +--------------+------+---------+--------+--------+  CFV                      no                        +--------------+------+---------+--------+--------+  FV prox                  no                        +--------------+------+---------+--------+--------+  FV mid                   no                        +--------------+------+---------+--------+--------+  FV dist                  no                        +--------------+------+---------+--------+--------+  Popliteal                no                        +--------------+------+---------+--------+--------+  [facility]               no                        +--------------+------+---------+--------+--------+  GSV prox thigh           no                        +--------------+------+---------+--------+--------+  GSV mid thigh            no                        +--------------+------+---------+--------+--------+  GSV dist thigh           no                        +--------------+------+---------+--------+--------+  GSV at knee              no                        +--------------+------+---------+--------+--------+  SSV Pop Fossa   yes               .27    1680      +--------------+------+---------+--------+--------+  +--------------+------+---------+--------+--------+  LEFT           Reflux Reflux No Diameter Comments                   Yes                                 +--------------+------+---------+--------+--------+  CFV                      no                        +--------------+------+---------+--------+--------+  FV prox                  no                        +--------------+------+---------+--------+--------+  FV mid                   no                        +--------------+------+---------+--------+--------+  FV dist                  no                        +--------------+------+---------+--------+--------+  Popliteal                no                        +--------------+------+---------+--------+--------+  GSV at [facility]               no                        +--------------+------+---------+--------+--------+  GSV prox thigh           no                        +--------------+------+---------+--------+--------+  GSV mid thigh            no                        +--------------+------+---------+--------+--------+  GSV dist thigh           no                        +--------------+------+---------+--------+--------+  GSV at knee              no                        +--------------+------+---------+--------+--------+  GSV prox calf            no                        +--------------+------+---------+--------+--------+  Summary: Right: There is no evidence of deep vein thrombosis in the lower extremity.There is no evidence of superficial venous thrombosis. Left: There is no evidence of superficial venous thrombosis.  *See table(s) above for measurements and observations. Electronically signed by Levora Dredge MD on 07/28/2019 at 5:36:28 PM.    Final      Assessment/Plan 1. Lymphedema Recommend:  No surgery or intervention at this point in time.    I have reviewed my previous discussion with the patient regarding swelling and why it causes symptoms.  Patient will continue wearing graduated compression stockings class 1 (20-30 mmHg) on a daily basis. The patient will  beginning wearing the stockings first thing in the morning and removing them in  the evening. The patient is instructed specifically not to sleep in the stockings.    In addition, behavioral modification including several periods of elevation of the lower extremities during the day will be continued.  This was reviewed with the patient during the initial visit.  The patient will also continue routine exercise, especially walking on a daily basis as was discussed during the initial visit.    Despite conservative treatments including graduated compression therapy class 1 and behavioral modification including exercise and elevation the patient  has not obtained adequate control of the lymphedema.  The patient still has stage 3 lymphedema and therefore, I believe that a lymph pump should be added to improve the control of the patient's lymphedema.  Additionally, a lymph pump is warranted because it will reduce the risk of cellulitis and ulceration in the future.  Patient should follow-up in six months    2. Pain in both lower extremities Recommend:  No surgery or intervention at this point in time.    I have reviewed my previous discussion with the patient regarding swelling and why it causes symptoms.  Patient will continue wearing graduated compression stockings class 1 (20-30 mmHg) on a daily basis. The patient will  beginning wearing the stockings first thing in the morning and removing them in the evening. The patient is instructed specifically not to sleep in the stockings.    In addition, behavioral modification including several periods of elevation of the lower extremities during the day will be continued.  This was reviewed with the patient during the initial visit.  The patient will also continue routine exercise, especially walking on a daily basis as was discussed during the initial visit.    Despite conservative treatments including graduated compression therapy class 1 and behavioral modification including exercise and elevation the patient  has not obtained  adequate control of the lymphedema.  The patient still has stage 3 lymphedema and therefore, I believe that a lymph pump should be added to improve the control of the patient's lymphedema.  Additionally, a lymph pump is warranted because it will reduce the risk of cellulitis and ulceration in the future.  Patient should follow-up in six months    3. Mixed hyperlipidemia Continue statin as ordered and reviewed, no changes at this time   4. Chronic obstructive pulmonary disease, unspecified COPD type (HCC) Continue pulmonary medications and aerosols as already ordered, these medications have been reviewed and there are no changes at this time.     Levora Dredge, MD  07/28/2019 9:17 PM

## 2019-08-12 ENCOUNTER — Encounter (INDEPENDENT_AMBULATORY_CARE_PROVIDER_SITE_OTHER): Payer: Self-pay | Admitting: Vascular Surgery

## 2019-12-03 ENCOUNTER — Ambulatory Visit: Payer: BC Managed Care – PPO | Attending: Internal Medicine

## 2019-12-03 DIAGNOSIS — Z23 Encounter for immunization: Secondary | ICD-10-CM

## 2019-12-03 NOTE — Progress Notes (Signed)
   Covid-19 Vaccination Clinic  Name:  GIANNAMARIE PAULUS    MRN: [id number] DOB: [birth year]  12/03/2019  Ms. Laflamme was observed post Covid-19 immunization for 15 minutes without incident. She was provided with Vaccine Information Sheet and instruction to access the V-Safe system.   Ms. Frame was instructed to call 911 with any severe reactions post vaccine: Marland Kitchen Difficulty breathing  . Swelling of face and throat  . A fast heartbeat  . A bad rash all over body  . Dizziness and weakness   Immunizations Administered    Name Date Dose VIS Date Route   Pfizer COVID-19 Vaccine 12/03/2019  2:32 PM 0.3 mL 09/02/2019 Intramuscular   Manufacturer: [employer], [employer]   Lot: [id number]   NDC: [address]-8

## 2019-12-27 ENCOUNTER — Ambulatory Visit: Payer: BC Managed Care – PPO

## 2020-01-03 ENCOUNTER — Encounter: Payer: Self-pay | Admitting: Emergency Medicine

## 2020-01-03 ENCOUNTER — Ambulatory Visit
Admission: EM | Admit: 2020-01-03 | Discharge: 2020-01-03 | Disposition: A | Discharge status: Home / Self Care | Payer: BC Managed Care – PPO | Attending: Family Medicine | Admitting: Family Medicine

## 2020-01-03 ENCOUNTER — Other Ambulatory Visit: Payer: Self-pay

## 2020-01-03 DIAGNOSIS — K529 Noninfective gastroenteritis and colitis, unspecified: Secondary | ICD-10-CM

## 2020-01-03 DIAGNOSIS — E785 Hyperlipidemia, unspecified: Secondary | ICD-10-CM | POA: Diagnosis not present

## 2020-01-03 DIAGNOSIS — J449 Chronic obstructive pulmonary disease, unspecified: Secondary | ICD-10-CM | POA: Diagnosis not present

## 2020-01-03 DIAGNOSIS — R112 Nausea with vomiting, unspecified: Secondary | ICD-10-CM | POA: Diagnosis present

## 2020-01-03 DIAGNOSIS — M797 Fibromyalgia: Secondary | ICD-10-CM | POA: Diagnosis not present

## 2020-01-03 DIAGNOSIS — E860 Dehydration: Secondary | ICD-10-CM | POA: Diagnosis not present

## 2020-01-03 DIAGNOSIS — F17210 Nicotine dependence, cigarettes, uncomplicated: Secondary | ICD-10-CM | POA: Diagnosis not present

## 2020-01-03 DIAGNOSIS — Z79899 Other long term (current) drug therapy: Secondary | ICD-10-CM | POA: Insufficient documentation

## 2020-01-03 DIAGNOSIS — G894 Chronic pain syndrome: Secondary | ICD-10-CM | POA: Diagnosis not present

## 2020-01-03 DIAGNOSIS — F419 Anxiety disorder, unspecified: Secondary | ICD-10-CM | POA: Diagnosis not present

## 2020-01-03 LAB — COMPREHENSIVE METABOLIC PANEL
ALT: 16 U/L (ref 0–44)
AST: 17 U/L (ref 15–41)
Albumin: 3.7 g/dL (ref 3.5–5.0)
Alkaline Phosphatase: 135 U/L — ABNORMAL HIGH (ref 38–126)
Anion gap: 12 (ref 5–15)
BUN: 12 mg/dL (ref 6–20)
CO2: 22 mmol/L (ref 22–32)
Calcium: 8.7 mg/dL — ABNORMAL LOW (ref 8.9–10.3)
Chloride: 105 mmol/L (ref 98–111)
Creatinine, Ser: 0.92 mg/dL (ref 0.44–1.00)
GFR calc Af Amer: >60 mL/min (ref >60)
GFR calc non Af Amer: >60 mL/min (ref >60)
Glucose, Bld: 113 mg/dL — ABNORMAL HIGH (ref 70–99)
Potassium: 4.1 mmol/L (ref 3.5–5.1)
Sodium: 139 mmol/L (ref 135–145)
Total Bilirubin: 0.8 mg/dL (ref 0.3–1.2)
Total Protein: 7.7 g/dL (ref 6.5–8.1)

## 2020-01-03 LAB — CBC WITH DIFFERENTIAL/PLATELET
Abs Immature Granulocytes: 0.07 10*3/uL (ref 0.00–0.07)
Basophils Absolute: 0 10*3/uL (ref 0.0–0.1)
Basophils Relative: 0 %
Eosinophils Absolute: 0 10*3/uL (ref 0.0–0.5)
Eosinophils Relative: 0 %
HCT: 44.6 % (ref 36.0–46.0)
Hemoglobin: 15.2 g/dL — ABNORMAL HIGH (ref 12.0–15.0)
Immature Granulocytes: 1 %
Lymphocytes Relative: 10 %
Lymphs Abs: 1.2 10*3/uL (ref 0.7–4.0)
MCH: 30.8 pg (ref 26.0–34.0)
MCHC: 34.1 g/dL (ref 30.0–36.0)
MCV: 90.3 fL (ref 80.0–100.0)
Monocytes Absolute: 0.4 10*3/uL (ref 0.1–1.0)
Monocytes Relative: 4 %
Neutro Abs: 9.9 10*3/uL — ABNORMAL HIGH (ref 1.7–7.7)
Neutrophils Relative %: 85 %
Platelets: 374 10*3/uL (ref 150–400)
RBC: 4.94 MIL/uL (ref 3.87–5.11)
RDW: 14.4 % (ref 11.5–15.5)
WBC: 11.7 10*3/uL — ABNORMAL HIGH (ref 4.0–10.5)
nRBC: 0 % (ref 0.0–0.2)

## 2020-01-03 LAB — LIPASE, BLOOD: Lipase: 17 U/L (ref 11–51)

## 2020-01-03 MED ORDER — SODIUM CHLORIDE 0.9 % IV BOLUS
1000.0000 mL | Freq: Once | INTRAVENOUS | Status: AC
  Administered 2020-01-03: 1000 mL via INTRAVENOUS

## 2020-01-03 MED ORDER — ONDANSETRON HCL 4 MG/2ML IJ SOLN
4.0000 mg | Freq: Once | INTRAMUSCULAR | Status: AC
  Administered 2020-01-03: 4 mg via INTRAVENOUS

## 2020-01-03 MED ORDER — ONDANSETRON 4 MG PO TBDP: 4.0000 mg | ORAL_TABLET | Freq: Three times a day (TID) | ORAL | 0 refills | Status: DC | PRN

## 2020-01-03 NOTE — ED Triage Notes (Addendum)
Patient c/o vomiting and diarrhea that started last night around 11pm. She states she has not been able to keep any liquids down since then. She states she has been around someone who has similar symptoms.

## 2020-01-03 NOTE — ED Provider Notes (Signed)
[facility]    CSN: [id number] Arrival date & time: 01/03/20  1410  History   Chief Complaint Chief Complaint  Patient presents with   Emesis   Diarrhea   HPI   49 year old female presents with nausea, vomiting, diarrhea.  Started abruptly last night around 11 PM.  Patient reports that she has recently been around her daughter and her son-in-law who have recently had a viral gastrointestinal illness.  Patient reports that she developed nausea, vomiting, diarrhea last night.  It has continued to persist.  She feels poorly.  Associated abdominal pain.  No documented fever.  She has been unable to keep anything down.  No relieving factors.  No other associated symptoms.  No other complaints.  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   Tobacco use disorder 06/06/2019   Lymphedema 05/05/2019   Leg pain 05/05/2019   GAD (generalized anxiety disorder) 05/02/2019   Panic attacks 05/02/2019   MDD (major depressive disorder), recurrent episode, moderate (HCC) 04/20/2019   Abnormal uterine bleeding (AUB) 12/01/2018   Chronic pain syndrome 07/01/2018   Mild obesity 05/05/2016   COPD (chronic obstructive pulmonary disease) (HCC) 01/31/2014   EBV infection 01/31/2014   Fatigue 01/31/2014   Hyperlipidemia 01/31/2014   Major depression, recurrent, chronic (HCC) 10/08/2012   Depression 09/20/2012    Past Surgical History:  Procedure Laterality Date   BREAST BIOPSY Right 2012   negative, stereotactic biopsy   foot sugery Bilateral     OB History   No obstetric history on file.      Home Medications    Prior to Admission medications   Medication Sig Start Date End Date Taking? Authorizing Provider  atorvastatin (LIPITOR) 20 MG tablet  Take by mouth. 02/02/18  Yes [provider]  Cholecalciferol 100 MCG (4000 UT) TABS Take by mouth.   Yes [provider]  HYDROcodone-acetaminophen (NORCO) 10-325 MG tablet Take by mouth. 12/23/18  Yes [provider]  lamoTRIgine (LAMICTAL) 25 MG tablet  07/25/19  Yes [provider]  triamterene-hydrochlorothiazide (MAXZIDE) 75-50 MG tablet Take by mouth. 03/08/19 03/07/20 Yes [provider]  ondansetron (ZOFRAN-ODT) 4 MG disintegrating tablet Take 1 tablet (4 mg total) by mouth every 8 (eight) hours as needed for nausea or vomiting. 01/03/20   Tommie Sams, DO  RABEprazole (ACIPHEX) 20 MG tablet Take by mouth. 02/01/18 07/28/19  [provider]  lurasidone (LATUDA) 20 MG TABS tablet Take 1 tablet (20 mg total) by mouth daily with supper. For depression Patient not taking: Reported on 07/28/2019 07/06/19 01/03/20  Jomarie Longs, MD  mirtazapine (REMERON) 45 MG tablet Take 1 tablet (45 mg total) by mouth at bedtime. Patient not taking: Reported on 07/28/2019 05/02/19 01/03/20  Jomarie Longs, MD  propranolol (INDERAL) 10 MG tablet Take 1 tablet (10 mg total) by mouth 2 (two) times daily as needed. For severe anxiety attacks 03/22/19 01/03/20  Jomarie Longs, MD  sertraline (ZOLOFT) 50 MG tablet Take 50 mg by mouth daily. 07/25/19 01/03/20  [provider]    Family History Family History  Problem Relation Age of Onset   Depression Father    Breast cancer Neg Hx     Social History Social History   Tobacco Use  Smoking status: Current Every Day Smoker    Packs/day: 1.00    Types: Cigarettes   Smokeless tobacco: Never Used  Substance Use Topics   Alcohol use: No   Drug use: No     Allergies   Patient has no known allergies.   Review of Systems Review of Systems  Constitutional: Positive for appetite change.  Gastrointestinal: Positive for diarrhea, nausea and vomiting.   Physical Exam Triage Vital Signs ED Triage  Vitals  Enc Vitals Group     BP 01/03/20 1427 92/68     Pulse Rate 01/03/20 1427 81     Resp 01/03/20 1427 18     Temp 01/03/20 1427 98.7 F (37.1 C)     Temp Source 01/03/20 1427 Oral     SpO2 01/03/20 1427 99 %     Weight 01/03/20 1425 240 lb (108.9 kg)     Height 01/03/20 1425 5\' 4"  (1.626 m)     Head Circumference --      Peak Flow --      Pain Score 01/03/20 1425 0     Pain Loc --      Pain Edu? --      Excl. in [place]? --    Orthostatic VS for the past 24 hrs:  BP- Lying Pulse- Lying BP- Sitting Pulse- Sitting BP- Standing at 0 minutes Pulse- Standing at 0 minutes  01/03/20 1431 118/68 80 119/70 86 133/80 93    Updated Vital Signs BP 92/68 (BP Location: Left Arm)    Pulse 81    Temp 98.7 F (37.1 C) (Oral)    Resp 18    Ht 5\' 4"  (1.626 m)    Wt 108.9 kg    SpO2 99%    BMI 41.20 kg/m   Visual Acuity Right Eye Distance:   Left Eye Distance:   Bilateral Distance:    Right Eye Near:   Left Eye Near:    Bilateral Near:     Physical Exam Vitals and nursing note reviewed.  Constitutional:      Comments: Appears mildly ill.  In no acute distress.  HENT:     Head: Normocephalic.  Eyes:     General:        Right eye: No discharge.        Left eye: No discharge.     Conjunctiva/sclera: Conjunctivae normal.  Cardiovascular:     Rate and Rhythm: Normal rate and regular rhythm.  Pulmonary:     Effort: Pulmonary effort is normal.     Breath sounds: Normal breath sounds. No wheezing, rhonchi or rales.  Abdominal:     General: There is no distension.     Palpations: Abdomen is soft.     Tenderness: There is no abdominal tenderness.  Neurological:     Mental Status: She is alert.  Psychiatric:        Mood and Affect: Mood normal.        Behavior: Behavior normal.    UC Treatments / Results  Labs (all labs ordered are listed, but only abnormal results are displayed) Labs Reviewed  CBC WITH DIFFERENTIAL/PLATELET - Abnormal; Notable for the following components:       Result Value   WBC 11.7 (*)    Hemoglobin 15.2 (*)    Neutro Abs 9.9 (*)    All other components within normal limits  COMPREHENSIVE METABOLIC PANEL - Abnormal; Notable for the following components:   Glucose, Bld 113 (*)    Calcium 8.7 (*)  Alkaline Phosphatase 135 (*)    All other components within normal limits  LIPASE, BLOOD    EKG   Radiology No results found.  Procedures Procedures (including critical care time)  Medications Ordered in UC Medications  sodium chloride 0.9 % bolus 1,000 mL (0 mLs Intravenous Stopped 01/03/20 1611)  ondansetron (ZOFRAN) injection 4 mg (4 mg Intravenous Given 01/03/20 1503)    Initial Impression / Assessment and Plan / UC Course  I have reviewed the triage vital signs and the nursing notes.  Pertinent labs & imaging results that were available during my care of the patient were reviewed by me and considered in my medical decision making (see chart for details).    49 year old female presents with dehydration secondary to gastroenteritis.  Labs obtained and were notable for mild leukocytosis and hemoconcentration.  IV was placed and 1 L normal saline was given.  Patient was also given IV Zofran.  Patient states that she felt much better after fluids and medication.  Patient will be discharged home on Zofran.  Advised to push fluids.  Work note given.  Final Clinical Impressions(s) / UC Diagnoses   Final diagnoses:  Dehydration  Gastroenteritis     Discharge Instructions     If you worsen, go to the ER.  Medication as prescribed.  Diet and fluids as we discussed.  Take care  Dr. Adriana Simas     ED Prescriptions    Medication Sig Dispense Auth. Provider   ondansetron (ZOFRAN-ODT) 4 MG disintegrating tablet Take 1 tablet (4 mg total) by mouth every 8 (eight) hours as needed for nausea or vomiting. 20 tablet Tommie Sams, DO     PDMP not reviewed this encounter.   Tommie Sams, [place] 01/03/20 1747

## 2020-01-03 NOTE — Discharge Instructions (Signed)
If you worsen, go to the ER.  Medication as prescribed.  Diet and fluids as we discussed.  Take care  Dr. Adriana Simas

## 2020-01-04 ENCOUNTER — Ambulatory Visit: Payer: Self-pay

## 2020-01-10 ENCOUNTER — Ambulatory Visit: Payer: Self-pay

## 2020-01-11 ENCOUNTER — Ambulatory Visit: Payer: BC Managed Care – PPO | Attending: Internal Medicine

## 2020-01-11 DIAGNOSIS — Z23 Encounter for immunization: Secondary | ICD-10-CM

## 2020-01-11 NOTE — Progress Notes (Signed)
   Covid-19 Vaccination Clinic  Name:  CECILE GILLISPIE    MRN: [id number] DOB: 15-Aug-1971  01/11/2020  Ms. Ekblad was observed post Covid-19 immunization for 15 minutes without incident. She was provided with Vaccine Information Sheet and instruction to access the V-Safe system.   Ms. Pinkstaff was instructed to call 911 with any severe reactions post vaccine: Marland Kitchen Difficulty breathing  . Swelling of face and throat  . A fast heartbeat  . A bad rash all over body  . Dizziness and weakness   Immunizations Administered    Name Date Dose VIS Date Route   Pfizer COVID-19 Vaccine 01/11/2020  4:40 PM 0.3 mL 11/16/2018 Intramuscular   Manufacturer: [employer], [employer]   Lot: [id number]   NDC: [address]-7

## 2020-02-27 ENCOUNTER — Ambulatory Visit (INDEPENDENT_AMBULATORY_CARE_PROVIDER_SITE_OTHER)
Admission: RE | Admit: 2020-02-27 | Discharge: 2020-02-27 | Disposition: A | Discharge status: Home / Self Care | Payer: BC Managed Care – PPO | Source: Ambulatory Visit

## 2020-02-27 DIAGNOSIS — H1032 Unspecified acute conjunctivitis, left eye: Secondary | ICD-10-CM

## 2020-02-27 MED ORDER — POLYMYXIN B-TRIMETHOPRIM 10000-0.1 UNIT/ML-% OP SOLN: 1.0000 [drp] | Freq: Four times a day (QID) | OPHTHALMIC | 0 refills | Status: AC

## 2020-02-27 NOTE — Discharge Instructions (Signed)
Use the antibiotic eyedrops as prescribed.    Follow-up with your eye doctor for a recheck in 1 to 2 days if your symptoms are not improving.    Go to the emergency department if you have acute eye pain or changes in your vision.    

## 2020-02-27 NOTE — ED Provider Notes (Signed)
Virtual Visit via Video Note:  Madison Morgan  initiated request for Telemedicine visit with [facility] team. I connected with Madison Morgan  on 02/27/2020 at 3:50 PM  for a synchronized telemedicine visit using a video enabled HIPPA compliant telemedicine application. I verified that I am speaking with Madison Morgan  using two identifiers. Sharion Balloon, NP  was physically located in a [facility] site and CLELIA TRABUCCO was located at a different location.   The limitations of evaluation and management by telemedicine as well as the availability of in-person appointments were discussed. Patient was informed that she  may incur a bill ( including co-pay) for this virtual visit encounter. Muniz  expressed understanding and gave verbal consent to proceed with virtual visit.     History of Present Illness:Madison Morgan  is a 49 y.o. female presents for evaluation of left eye swelling, tearing, itching, feels like "sand" in eye, crusting and matted shut in the morning x 3 days.  No injury.  She denies eye pain or changes in vision.  She denies fever, ear pain, sore throat, cough, SOB, vomiting, diarrhea, rash, or other symptoms. No treatment attempted at home.  She denies current pregnancy or breastfeeding.      No Known Allergies   Past Medical History:  Diagnosis Date  . Anxiety   . Chronic back pain   . Depression   . Fibromyalgia   . Hyperlipemia    currently taking lipitor     Social History   Tobacco Use  . Smoking status: Current Every Day Smoker    Packs/day: 1.00    Types: Cigarettes  . Smokeless tobacco: Never Used  Substance Use Topics  . Alcohol use: No  . Drug use: No   ROS: as stated in HPI.  All other systems reviewed and negative.       Observations/Objective: Physical Exam  VITALS: Patient denies fever. GENERAL: Alert, appears well and in no acute distress. HEENT: Atraumatic. Left eyelids appear mildly edematous.   NECK: Normal movements of the head and neck. CARDIOPULMONARY: No increased WOB. Speaking in clear sentences. I:E ratio WNL.  MS: Moves all visible extremities without noticeable abnormality. PSYCH: Pleasant and cooperative, well-groomed. Speech normal rate and rhythm. Affect is appropriate. Insight and judgement are appropriate. Attention is focused, linear, and appropriate.  NEURO: CN grossly intact. Oriented as arrived to appointment on time with no prompting. Moves both UE equally.  SKIN: No obvious lesions, wounds, erythema, or cyanosis noted on face or hands.   Assessment and Plan:    ICD-10-CM   1. Acute bacterial conjunctivitis of left eye  H10.32        Follow Up Instructions: Treating with Polytrim eye drops.  Instructed patient to follow-up with her eye care provider for recheck in 1-2 days if her symptoms are not improving.  Instructed her to go to the emergency department if she has acute eye pain or changes in her vision.  Patient agrees to plan of care.    I discussed the assessment and treatment plan with the patient. The patient was provided an opportunity to ask questions and all were answered. The patient agreed with the plan and demonstrated an understanding of the instructions.   The patient was advised to call back or seek an in-person evaluation if the symptoms worsen or if the condition fails to improve as anticipated.      Sharion Balloon, NP  02/27/2020 3:50 PM         Mickie Bail, NP 02/27/20 [id number]

## 2020-07-11 ENCOUNTER — Other Ambulatory Visit: Payer: Self-pay | Admitting: Internal Medicine

## 2020-07-11 DIAGNOSIS — Z1231 Encounter for screening mammogram for malignant neoplasm of breast: Secondary | ICD-10-CM

## 2020-07-25 ENCOUNTER — Ambulatory Visit: Payer: BC Managed Care – PPO

## 2020-07-30 ENCOUNTER — Ambulatory Visit: Payer: BC Managed Care – PPO

## 2020-08-09 ENCOUNTER — Ambulatory Visit: Payer: BC Managed Care – PPO

## 2020-09-25 DIAGNOSIS — K449 Diaphragmatic hernia without obstruction or gangrene: Secondary | ICD-10-CM | POA: Insufficient documentation

## 2020-10-01 ENCOUNTER — Ambulatory Visit: Payer: BC Managed Care – PPO

## 2020-10-25 ENCOUNTER — Other Ambulatory Visit: Payer: Self-pay

## 2020-10-25 ENCOUNTER — Ambulatory Visit
Admission: RE | Admit: 2020-10-25 | Discharge: 2020-10-25 | Disposition: A | Discharge status: Home / Self Care | Payer: BC Managed Care – PPO | Source: Ambulatory Visit | Attending: Internal Medicine | Admitting: Internal Medicine

## 2020-10-25 DIAGNOSIS — Z1231 Encounter for screening mammogram for malignant neoplasm of breast: Secondary | ICD-10-CM | POA: Diagnosis not present

## 2020-12-01 ENCOUNTER — Other Ambulatory Visit: Payer: Self-pay

## 2020-12-01 DIAGNOSIS — F17210 Nicotine dependence, cigarettes, uncomplicated: Secondary | ICD-10-CM | POA: Insufficient documentation

## 2020-12-01 DIAGNOSIS — R1013 Epigastric pain: Secondary | ICD-10-CM | POA: Diagnosis present

## 2020-12-01 DIAGNOSIS — J449 Chronic obstructive pulmonary disease, unspecified: Secondary | ICD-10-CM | POA: Diagnosis not present

## 2020-12-01 DIAGNOSIS — Z79899 Other long term (current) drug therapy: Secondary | ICD-10-CM | POA: Diagnosis not present

## 2020-12-01 NOTE — ED Triage Notes (Signed)
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 Reports epigastric and chest pain onset today where pt reports sleeve is. Reports associated SOB. Reports 2 episodes emesis, denies diarrhea.

## 2020-12-02 ENCOUNTER — Emergency Department: Payer: BC Managed Care – PPO

## 2020-12-02 ENCOUNTER — Emergency Department
Admission: EM | Admit: 2020-12-02 | Discharge: 2020-12-02 | Disposition: A | Discharge status: Home / Self Care | Payer: BC Managed Care – PPO | Attending: Emergency Medicine | Admitting: Emergency Medicine

## 2020-12-02 DIAGNOSIS — R1013 Epigastric pain: Secondary | ICD-10-CM

## 2020-12-02 LAB — CBC
HCT: 41.6 % (ref 36.0–46.0)
Hemoglobin: 14.1 g/dL (ref 12.0–15.0)
MCH: 32.2 pg (ref 26.0–34.0)
MCHC: 33.9 g/dL (ref 30.0–36.0)
MCV: 95 fL (ref 80.0–100.0)
Platelets: 330 10*3/uL (ref 150–400)
RBC: 4.38 MIL/uL (ref 3.87–5.11)
RDW: 12.8 % (ref 11.5–15.5)
WBC: 14.7 10*3/uL — ABNORMAL HIGH (ref 4.0–10.5)
nRBC: 0 % (ref 0.0–0.2)

## 2020-12-02 LAB — TROPONIN I (HIGH SENSITIVITY): Troponin I (High Sensitivity): 3 ng/L (ref <18)

## 2020-12-02 LAB — COMPREHENSIVE METABOLIC PANEL
ALT: 27 U/L (ref 0–44)
AST: 16 U/L (ref 15–41)
Albumin: 4.1 g/dL (ref 3.5–5.0)
Alkaline Phosphatase: 139 U/L — ABNORMAL HIGH (ref 38–126)
Anion gap: 10 (ref 5–15)
BUN: 18 mg/dL (ref 6–20)
CO2: 22 mmol/L (ref 22–32)
Calcium: 9.3 mg/dL (ref 8.9–10.3)
Chloride: 105 mmol/L (ref 98–111)
Creatinine, Ser: 0.86 mg/dL (ref 0.44–1.00)
GFR, Estimated: >60 mL/min (ref >60)
Glucose, Bld: 128 mg/dL — ABNORMAL HIGH (ref 70–99)
Potassium: 3.9 mmol/L (ref 3.5–5.1)
Sodium: 137 mmol/L (ref 135–145)
Total Bilirubin: 1 mg/dL (ref 0.3–1.2)
Total Protein: 7.9 g/dL (ref 6.5–8.1)

## 2020-12-02 LAB — URINALYSIS, COMPLETE (UACMP) WITH MICROSCOPIC
Glucose, UA: NEGATIVE mg/dL
Hgb urine dipstick: NEGATIVE
Ketones, ur: 80 mg/dL — AB
Nitrite: NEGATIVE
Protein, ur: 30 mg/dL — AB
Specific Gravity, Urine: 1.038 — ABNORMAL HIGH (ref 1.005–1.030)
pH: 5 (ref 5.0–8.0)

## 2020-12-02 LAB — LIPASE, BLOOD: Lipase: 30 U/L (ref 11–51)

## 2020-12-02 MED ORDER — ONDANSETRON HCL 4 MG/2ML IJ SOLN
4.0000 mg | Freq: Once | INTRAMUSCULAR | Status: AC
  Administered 2020-12-02: 4 mg via INTRAVENOUS
  Filled 2020-12-02: qty 2

## 2020-12-02 MED ORDER — METHYLPREDNISOLONE 4 MG PO TBPK: ORAL_TABLET | ORAL | 0 refills | Status: AC

## 2020-12-02 MED ORDER — METHYLPREDNISOLONE SODIUM SUCC 125 MG IJ SOLR
80.0000 mg | Freq: Once | INTRAMUSCULAR | Status: AC
  Administered 2020-12-02: 80 mg via INTRAVENOUS
  Filled 2020-12-02: qty 2

## 2020-12-02 MED ORDER — HYDROCODONE-ACETAMINOPHEN 10-325 MG PO TABS
1.0000 | ORAL_TABLET | Freq: Once | ORAL | Status: AC
  Administered 2020-12-02: 1 via ORAL
  Filled 2020-12-02: qty 1

## 2020-12-02 MED ORDER — IOHEXOL 9 MG/ML PO SOLN: 1000.0000 mL | Freq: Once | ORAL | Status: DC | PRN

## 2020-12-02 MED ORDER — ACETAMINOPHEN 325 MG PO TABS
650.0000 mg | ORAL_TABLET | Freq: Once | ORAL | Status: AC
  Administered 2020-12-02: 650 mg via ORAL
  Filled 2020-12-02: qty 2

## 2020-12-02 MED ORDER — FENTANYL CITRATE (PF) 100 MCG/2ML IJ SOLN
50.0000 ug | Freq: Once | INTRAMUSCULAR | Status: AC
  Administered 2020-12-02: 50 ug via INTRAVENOUS
  Filled 2020-12-02: qty 2

## 2020-12-02 MED ORDER — LACTATED RINGERS IV BOLUS
1000.0000 mL | Freq: Once | INTRAVENOUS | Status: AC
  Administered 2020-12-02: 1000 mL via INTRAVENOUS

## 2020-12-02 MED ORDER — IOHEXOL 350 MG/ML SOLN
100.0000 mL | Freq: Once | INTRAVENOUS | Status: AC | PRN
  Administered 2020-12-02: 100 mL via INTRAVENOUS

## 2020-12-02 NOTE — ED Notes (Signed)
PO challenge initiated, pt able to tolerate apple juice and PO meds. Pt reports pain when swallowing, but denies nausea at this time.

## 2020-12-02 NOTE — Discharge Instructions (Signed)
Your imaging and labs did not show any complications associated with the surgery.  Your bariatric team recommended that you take a course of steroids at home which has been prescribed to you.  Continue pain medication at home.  If you feel stable, call their office first thing in the morning for an appointment.  If you feel worse, they recommended driving to [facility] emergency department so they can see you over there.

## 2020-12-02 NOTE — ED Provider Notes (Signed)
[facility] Emergency Department Provider Note  ____________________________________________  Time seen: Approximately 1:55 AM  I have reviewed the triage vital signs and the nursing notes.   HISTORY  Chief Complaint Abdominal Pain   HPI Madison Morgan is a 50 y.o. female status post duodenal switch done 10 days ago at [facility] who presents for evaluation of abdominal pain.  Patient reports that she was recovering well from the surgery and to started having pain this morning.  She reports that the pain is epigastric and under the left breast, dull deep and severe pain but has been intermittent throughout the day.  She has had nausea and 2 episodes of nonbloody nonbilious emesis.  She reports that the when the pain is severe she has difficulty breathing.  She denies pleuritic chest pain, cough, fever, dysuria or hematuria.  She reports that she has been moving her bowels although less than normal since having the surgery.  No prior history of PE or DVT, no leg pain or swelling, no hemoptysis.  She is on Eliquis and was started after the surgery.   Past Medical History:  Diagnosis Date  . Anxiety   . Chronic back pain   . Depression   . Fibromyalgia   . Hyperlipemia    currently taking lipitor    Patient Active Problem List   Diagnosis Date Noted  . 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 Tobacco use disorder 06/06/2019  . Lymphedema 05/05/2019  . Leg pain 05/05/2019  . GAD (generalized anxiety disorder) 05/02/2019  . Panic attacks 05/02/2019  . MDD (major depressive disorder), recurrent episode, moderate (HCC) 04/20/2019  . Abnormal uterine bleeding (AUB) 12/01/2018  . Chronic pain syndrome 07/01/2018  . Mild obesity 05/05/2016  . COPD (chronic obstructive pulmonary disease) (HCC) 01/31/2014  . EBV infection 01/31/2014  . Fatigue 01/31/2014  . Hyperlipidemia 01/31/2014  . Major depression, recurrent, chronic (HCC) 10/08/2012  .  Depression 09/20/2012    Past Surgical History:  Procedure Laterality Date  . BREAST BIOPSY Right 2012   negative, stereotactic biopsy  . foot sugery Bilateral     Prior to Admission medications   Medication Sig Start Date End Date Taking? Authorizing Provider  methylPREDNISolone (MEDROL DOSEPAK) 4 MG TBPK tablet Follow package instructions 12/02/20 12/08/20 Yes Don Perking, [place], [place]  atorvastatin (LIPITOR) 20 MG tablet Take by mouth. 02/02/18   [provider]  Cholecalciferol 100 MCG (4000 UT) TABS Take by mouth.    [provider]  HYDROcodone-acetaminophen (NORCO) 10-325 MG tablet Take by mouth. 12/23/18   [provider]  lamoTRIgine (LAMICTAL) 25 MG tablet  07/25/19   [provider]  ondansetron (ZOFRAN-ODT) 4 MG disintegrating tablet Take 1 tablet (4 mg total) by mouth every 8 (eight) hours as needed for nausea or vomiting. 01/03/20   Tommie Sams, DO  RABEprazole (ACIPHEX) 20 MG tablet Take by mouth. 02/01/18 07/28/19  [provider]  triamterene-hydrochlorothiazide (MAXZIDE) 75-50 MG tablet Take by mouth. 03/08/19 03/07/20  [provider]  lurasidone (LATUDA) 20 MG TABS tablet Take 1 tablet (20 mg total) by mouth daily with supper. For depression Patient not taking: Reported on 07/28/2019 07/06/19 01/03/20  Jomarie Longs, MD  mirtazapine (REMERON) 45 MG tablet Take 1 tablet (45 mg total) by mouth at bedtime. Patient not taking: Reported on 07/28/2019 05/02/19 01/03/20  Jomarie Longs, MD  propranolol (INDERAL) 10 MG tablet Take 1 tablet (10 mg total) by mouth 2 (two) times daily as needed.  For severe anxiety attacks 03/22/19 01/03/20  Jomarie Longs, MD  sertraline (ZOLOFT) 50 MG tablet Take 50 mg by mouth daily. 07/25/19 01/03/20  [provider]    Allergies Patient has no known allergies.  Family History  Problem Relation Age of Onset  . Depression Father   . Breast cancer Neg Hx     Social History Social History    Tobacco Use  . Smoking status: Current Every Day Smoker    Packs/day: 1.00    Types: Cigarettes  . Smokeless tobacco: Never Used  Vaping Use  . Vaping Use: Never used  Substance Use Topics  . Alcohol use: No  . Drug use: No    Review of Systems  Constitutional: Negative for fever. Eyes: Negative for visual changes. ENT: Negative for sore throat. Neck: No neck pain  Cardiovascular: + lower chest pain. Respiratory: + shortness of breath. Gastrointestinal: + upper abdominal pain, nausea, vomiting. No diarrhea. Genitourinary: Negative for dysuria. Musculoskeletal: Negative for back pain. Skin: Negative for rash. Neurological: Negative for headaches, weakness or numbness. Psych: No SI or HI  ____________________________________________   PHYSICAL EXAM:  VITAL SIGNS: ED Triage Vitals  Enc Vitals Group     BP 12/01/20 2355 136/69     Pulse Rate 12/01/20 2355 (!) 112     Resp 12/01/20 2355 18     Temp 12/01/20 2355 98.5 F (36.9 C)     Temp Source 12/01/20 2355 Oral     SpO2 12/01/20 2355 95 %     Weight 12/01/20 2356 229 lb (103.9 kg)     Height 12/01/20 2356  (1.651 m)     Head Circumference --      Peak Flow --      Pain Score 12/01/20 2355 3     Pain Loc --      Pain Edu? --      Excl. in GC? --     Constitutional: Alert and oriented, looks uncomfortable due to pain HEENT:      Head: Normocephalic and atraumatic.         Eyes: Conjunctivae are normal. Sclera is non-icteric.       Mouth/Throat: Mucous membranes are moist.       Neck: Supple with no signs of meningismus. Cardiovascular: Tachycardic with regular rhythm. Respiratory: Normal respiratory effort. Lungs are clear to auscultation bilaterally.  Gastrointestinal: Soft, tender to palpation over the epigastric and LUQ, non distended with positive bowel sounds. No rebound or guarding. Genitourinary: No CVA tenderness. Musculoskeletal:  No edema, cyanosis, or erythema of extremities. Neurologic:  Normal speech and language. Face is symmetric. Moving all extremities. No gross focal neurologic deficits are appreciated. Skin: Skin is warm, dry and intact. No rash noted. Psychiatric: Mood and affect are normal. Speech and behavior are normal.  ____________________________________________   LABS (all labs ordered are listed, but only abnormal results are displayed)  Labs Reviewed  COMPREHENSIVE METABOLIC PANEL - Abnormal; Notable for the following components:      Result Value   Glucose, Bld 128 (*)    Alkaline Phosphatase 139 (*)    All other components within normal limits  CBC - Abnormal; Notable for the following components:   WBC 14.7 (*)    All other components within normal limits  URINALYSIS, COMPLETE (UACMP) WITH MICROSCOPIC - Abnormal; Notable for the following components:   Color, Urine AMBER (*)    APPearance CLOUDY (*)    Specific Gravity, Urine 1.038 (*)    Bilirubin Urine  SMALL (*)    Ketones, ur 80 (*)    Protein, ur 30 (*)    Leukocytes,Ua TRACE (*)    Bacteria, UA RARE (*)    All other components within normal limits  LIPASE, BLOOD  TROPONIN I (HIGH SENSITIVITY)   ____________________________________________  EKG  ED ECG REPORT I, Nita Sickle, the attending physician, personally viewed and interpreted this ECG.  Sinus tachycardia, rate of 103, normal intervals, normal axis, no ST elevations or depressions. ____________________________________________  RADIOLOGY  I have personally reviewed the images performed during this visit and I agree with the Radiologist's read.   Interpretation by Radiologist:  CT Angio Chest PE W and/or Wo Contrast  Result Date: 12/02/2020 CLINICAL DATA:  Duodenal switch done on 11/22/20. Reports epigastric and chest pain onset today where pt reports sleeve is. Reports associated SOB. Reports 2 episodes emesis, denies diarrhea EXAM: CT ANGIOGRAPHY CHEST CT ABDOMEN AND PELVIS WITH CONTRAST TECHNIQUE: Multidetector CT  imaging of the chest was performed using the standard protocol during bolus administration of intravenous contrast. Multiplanar CT image reconstructions and MIPs were obtained to evaluate the vascular anatomy. Multidetector CT imaging of the abdomen and pelvis was performed using the standard protocol during bolus administration of intravenous contrast. CONTRAST:  OMNIPAQUE IOHEXOL 350 MG/ML SOLN COMPARISON:  CT angio chest 12/26/2014 FINDINGS: CTA CHEST FINDINGS Cardiovascular: Satisfactory opacification of the pulmonary arteries to the segmental level. No evidence of pulmonary embolism. Normal heart size. No significant pericardial effusion. The thoracic aorta is normal in caliber. No atherosclerotic plaque of the thoracic aorta. No coronary artery calcifications. Mediastinum/Nodes: No enlarged mediastinal, hilar, or axillary lymph nodes. Trachea and esophagus demonstrate no significant findings. Lungs/Pleura: Mild bilateral, right greater than left, apical paraseptal emphysematous changes. Bilateral lower lobe subsegmental atelectasis. No pulmonary nodule. No pulmonary mass. No focal consolidation. No pleural effusion. No pneumothorax. Musculoskeletal: No chest wall abnormality. No acute or significant osseous findings. Review of the MIP images confirms the above findings. CT ABDOMEN and PELVIS FINDINGS Hepatobiliary: The liver is enlarged measuring up to 18 cm. No focal liver abnormality. No gallstones or gallbladder wall thickening. Nonspecific trace pericholecystic fat stranding. No biliary dilatation. Pancreas: No focal lesion. Normal pancreatic contour. No surrounding inflammatory changes. No main pancreatic ductal dilatation. Spleen: Normal in size without focal abnormality. Adrenals/Urinary Tract: No adrenal nodule bilaterally. Bilateral kidneys enhance symmetrically. No hydronephrosis. No hydroureter. The urinary bladder is unremarkable. Stomach/Bowel: Status post duodenal switch surgical changes.  PO contrast reaches the rectum. No extravasation of PO contrast from the bowel lumen. Stomach is within normal limits. No evidence of bowel wall thickening or dilatation. No pneumatosis. Appendix appears normal. Vascular/Lymphatic: No significant vascular findings are present. No enlarged abdominal or pelvic lymph nodes. Reproductive: A t shaped intrauterine device is noted in grossly appropriate position. Uterus and bilateral adnexa are unremarkable. Other: Trace mesenteric fat stranding within the upper abdomen likely related to postsurgical changes. No intraperitoneal free fluid. No intraperitoneal free gas. No organized fluid collection. Musculoskeletal: Subcutaneus soft tissue edema. Healing abdominal incisions. No acute or significant osseous findings. Review of the MIP images confirms the above findings. IMPRESSION: 1. No pulmonary embolus. 2. No acute intrathoracic abnormality. 3. Trace mesenteric fat stranding within the upper abdomen likely related to postsurgical changes in the setting of recent duodenal switch procedure. Given findings just anterior to the pancreas, correlate with lipase levels to exclude mild acute pancreatitis. 4. Associated nonspecific trace pericholecystic fat stranding. No CT finding of calcified gallstones or gallbladder wall thickening. Consider  right upper quadrant ultrasound for further evaluation if clinically indicated. Electronically Signed   By: Tish FredericksonMorgane  Naveau M.D.   On: 12/02/2020 04:18   CT ABDOMEN PELVIS W CONTRAST  Result Date: 12/02/2020 CLINICAL DATA:  Duodenal switch done on 11/22/20. Reports epigastric and chest pain onset today where pt reports sleeve is. Reports associated SOB. Reports 2 episodes emesis, denies diarrhea EXAM: CT ANGIOGRAPHY CHEST CT ABDOMEN AND PELVIS WITH CONTRAST TECHNIQUE: Multidetector CT imaging of the chest was performed using the standard protocol during bolus administration of intravenous contrast. Multiplanar CT image reconstructions  and MIPs were obtained to evaluate the vascular anatomy. Multidetector CT imaging of the abdomen and pelvis was performed using the standard protocol during bolus administration of intravenous contrast. CONTRAST:  100mL OMNIPAQUE IOHEXOL 350 MG/ML SOLN COMPARISON:  CT angio chest 12/26/2014 FINDINGS: CTA CHEST FINDINGS Cardiovascular: Satisfactory opacification of the pulmonary arteries to the segmental level. No evidence of pulmonary embolism. Normal heart size. No significant pericardial effusion. The thoracic aorta is normal in caliber. No atherosclerotic plaque of the thoracic aorta. No coronary artery calcifications. Mediastinum/Nodes: No enlarged mediastinal, hilar, or axillary lymph nodes. Trachea and esophagus demonstrate no significant findings. Lungs/Pleura: Mild bilateral, right greater than left, apical paraseptal emphysematous changes. Bilateral lower lobe subsegmental atelectasis. No pulmonary nodule. No pulmonary mass. No focal consolidation. No pleural effusion. No pneumothorax. Musculoskeletal: No chest wall abnormality. No acute or significant osseous findings. Review of the MIP images confirms the above findings. CT ABDOMEN and PELVIS FINDINGS Hepatobiliary: The liver is enlarged measuring up to 18 cm. No focal liver abnormality. No gallstones or gallbladder wall thickening. Nonspecific trace pericholecystic fat stranding. No biliary dilatation. Pancreas: No focal lesion. Normal pancreatic contour. No surrounding inflammatory changes. No main pancreatic ductal dilatation. Spleen: Normal in size without focal abnormality. Adrenals/Urinary Tract: No adrenal nodule bilaterally. Bilateral kidneys enhance symmetrically. No hydronephrosis. No hydroureter. The urinary bladder is unremarkable. Stomach/Bowel: Status post duodenal switch surgical changes. PO contrast reaches the rectum. No extravasation of PO contrast from the bowel lumen. Stomach is within normal limits. No evidence of bowel wall  thickening or dilatation. No pneumatosis. Appendix appears normal. Vascular/Lymphatic: No significant vascular findings are present. No enlarged abdominal or pelvic lymph nodes. Reproductive: A t shaped intrauterine device is noted in grossly appropriate position. Uterus and bilateral adnexa are unremarkable. Other: Trace mesenteric fat stranding within the upper abdomen likely related to postsurgical changes. No intraperitoneal free fluid. No intraperitoneal free gas. No organized fluid collection. Musculoskeletal: Subcutaneus soft tissue edema. Healing abdominal incisions. No acute or significant osseous findings. Review of the MIP images confirms the above findings. IMPRESSION: 1. No pulmonary embolus. 2. No acute intrathoracic abnormality. 3. Trace mesenteric fat stranding within the upper abdomen likely related to postsurgical changes in the setting of recent duodenal switch procedure. Given findings just anterior to the pancreas, correlate with lipase levels to exclude mild acute pancreatitis. 4. Associated nonspecific trace pericholecystic fat stranding. No CT finding of calcified gallstones or gallbladder wall thickening. Consider right upper quadrant ultrasound for further evaluation if clinically indicated. Electronically Signed   By: Tish FredericksonMorgane  Naveau M.D.   On: 12/02/2020 04:18   US ABDOMEN LIMITED RUQ (LIVER/GB)  Result Date: 12/02/2020 CLINICAL DATA:  Epigastric and chest pain. duodenal switch on 11/22/2020. EXAM: ULTRASOUND ABDOMEN LIMITED RIGHT UPPER QUADRANT COMPARISON:  CT abdomen pelvis 11/22/2020 FINDINGS: Gallbladder: Sludge noted within the gallbladder lumen. No gallstones or wall thickening visualized. No sonographic Murphy sign noted by sonographer. Common bile duct: Diameter: 5 mm  Liver: No focal lesion identified. Increased parenchymal echogenicity. Portal vein is patent on color Doppler imaging with normal direction of blood flow towards the liver. Other: Trace simple free fluid along  the left hepatic lobe. IMPRESSION: 1. Sludge within the gallbladder with no acute cholecystitis or choledocholithiasis. 2. Hepatic steatosis. Please note limited evaluation for focal hepatic masses in a patient with hepatic steatosis due to decreased penetration of the acoustic ultrasound waves. 3. Trace simple free fluid likely postsurgical in etiology. Electronically Signed   By: Tish Frederickson M.D.   On: 12/02/2020 05:24      ____________________________________________   PROCEDURES  Procedure(s) performed:yes .1-3 Lead EKG Interpretation Performed by: Nita Sickle, MD Authorized by: Nita Sickle, MD     Interpretation: non-specific     ECG rate assessment: tachycardic     Rhythm: sinus tachycardia     Ectopy: none     Conduction: normal     Critical Care performed:  None ____________________________________________   INITIAL IMPRESSION / ASSESSMENT AND PLAN / ED COURSE  50 y.o. female status post duodenal switch done 10 days ago at [facility] [place] [facility] who presents for evaluation of abdominal pain. Patient currently on Eliquis for postop DVT/PE prevention.  Patient looks uncomfortable due to pain, abdomen is soft and nondistended with tenderness to palpation in the left upper quadrant and epigastric regions.  There is no rebound or guarding.  Initially tachycardic due to pain but vitals have improved after patient was medicated.  Differential diagnosis includes dehiscence of anastomosis site, bowel perforation, postop abscess, SBO, volvulus,  PE, ulceration.  Labs showing no significant electrolyte derangements, normal lipase and LFTs.  White count of 14.7.  UA with no evidence of infection.  Patient placed on telemetry for close monitoring.  Old medical records reviewed including surgical note from [facility].  Patient given IV fentanyl and Zofran for symptom relief.  CT a of the chest and CT abdomen pelvis pending.  Patient placed on telemetry for close  cardiorespiratory monitoring.  _________________________ 6:21 AM on 12/02/2020 -----------------------------------------  CT did not show any surgical complications.  There was questionable may be some mild pancreatitis however patient's lipase and LFTs are all within normal limits.  There was some question about the gallbladder therefore I sent patient for right upper quadrant ultrasound which shows some sludge but no evidence of cholecystitis or choledocholithiasis.  Patient is not tender on the right upper quadrant.  Her pain has improved significantly.  I reached out to Dr. Roderic Ovens team at [facility] to discuss patient's presentation and findings of imaging and labs.  I was instructed to give patient a dose of 80 mg of Solu-Medrol and a bag of fluids and discharge her home on a Medrol pack.  The bariatric surgery team from [facility] seems to think this is just a normal course of the procedure.  They will see patient first thing Monday morning in their office.  They instructed patient to go to [facility] if her symptoms start to get progressively worse.  Patient's pain is improved.  I will p.o. challenge her discharge after fluids.  Patient and her husband are comfortable with this plan.     _____________________________________________ Please note:  Patient was evaluated in Emergency Department today for the symptoms described in the history of present illness. Patient was evaluated in the context of the global COVID-19 pandemic, which necessitated consideration that the patient might be at risk for infection with the SARS-CoV-2 virus that causes COVID-19. Institutional  protocols and algorithms that pertain to the evaluation of patients at risk for COVID-19 are in a state of rapid change based on information released by regulatory bodies including the CDC and federal and state organizations. These policies and algorithms were followed during the patient's care in the ED.  Some ED evaluations and  interventions may be delayed as a result of limited staffing during the pandemic.   [place] Controlled Substance Database was reviewed by me. ____________________________________________   FINAL CLINICAL IMPRESSION(S) / ED DIAGNOSES   Final diagnoses:  Epigastric abdominal pain      NEW MEDICATIONS STARTED DURING THIS VISIT:  ED Discharge Orders         Ordered    methylPREDNISolone (MEDROL DOSEPAK) 4 MG TBPK tablet        12/02/20 1610           Note:  This document was prepared using Dragon voice recognition software and may include unintentional dictation errors.    Nita Sickle, MD 12/02/20 [phone]

## 2020-12-02 NOTE — ED Notes (Signed)
US at bedside at this time 

## 2020-12-04 DIAGNOSIS — D72829 Elevated white blood cell count, unspecified: Secondary | ICD-10-CM | POA: Insufficient documentation

## 2020-12-04 DIAGNOSIS — R748 Abnormal levels of other serum enzymes: Secondary | ICD-10-CM | POA: Insufficient documentation

## 2020-12-04 DIAGNOSIS — R1012 Left upper quadrant pain: Secondary | ICD-10-CM | POA: Insufficient documentation

## 2020-12-04 DIAGNOSIS — K838 Other specified diseases of biliary tract: Secondary | ICD-10-CM | POA: Insufficient documentation

## 2020-12-04 DIAGNOSIS — R112 Nausea with vomiting, unspecified: Secondary | ICD-10-CM | POA: Insufficient documentation

## 2020-12-04 DIAGNOSIS — E860 Dehydration: Secondary | ICD-10-CM | POA: Insufficient documentation

## 2020-12-06 DIAGNOSIS — K859 Acute pancreatitis without necrosis or infection, unspecified: Secondary | ICD-10-CM | POA: Insufficient documentation

## 2021-03-14 DIAGNOSIS — Z9884 Bariatric surgery status: Secondary | ICD-10-CM | POA: Insufficient documentation

## 2021-03-15 DIAGNOSIS — Z8659 Personal history of other mental and behavioral disorders: Secondary | ICD-10-CM | POA: Insufficient documentation

## 2021-03-15 DIAGNOSIS — R253 Fasciculation: Secondary | ICD-10-CM | POA: Insufficient documentation

## 2021-03-15 DIAGNOSIS — F958 Other tic disorders: Secondary | ICD-10-CM | POA: Insufficient documentation

## 2021-03-22 DIAGNOSIS — E43 Unspecified severe protein-calorie malnutrition: Secondary | ICD-10-CM | POA: Insufficient documentation

## 2022-01-01 ENCOUNTER — Other Ambulatory Visit: Payer: Self-pay | Admitting: Internal Medicine

## 2022-01-01 DIAGNOSIS — Z1231 Encounter for screening mammogram for malignant neoplasm of breast: Secondary | ICD-10-CM

## 2022-02-13 ENCOUNTER — Ambulatory Visit
Admission: RE | Admit: 2022-02-13 | Discharge: 2022-02-13 | Disposition: A | Discharge status: Home / Self Care | Payer: BC Managed Care – PPO | Source: Ambulatory Visit | Attending: Internal Medicine | Admitting: Internal Medicine

## 2022-02-13 DIAGNOSIS — Z1231 Encounter for screening mammogram for malignant neoplasm of breast: Secondary | ICD-10-CM | POA: Diagnosis present

## 2022-04-30 ENCOUNTER — Ambulatory Visit
Admission: RE | Admit: 2022-04-30 | Discharge: 2022-04-30 | Disposition: A | Discharge status: Home / Self Care | Payer: BC Managed Care – PPO | Source: Ambulatory Visit | Attending: Family Medicine | Admitting: Family Medicine

## 2022-04-30 VITALS — BP 110/74 | HR 70 | Temp 98.3°F | Resp 18

## 2022-04-30 DIAGNOSIS — U071 COVID-19: Secondary | ICD-10-CM | POA: Insufficient documentation

## 2022-04-30 LAB — SARS CORONAVIRUS 2 BY RT PCR: SARS Coronavirus 2 by RT PCR: NEGATIVE

## 2022-04-30 NOTE — ED Provider Notes (Signed)
Renaldo Fiddler    CSN: [id number] Arrival date & time: 04/30/22  1535      History   Chief Complaint Chief Complaint  Patient presents with   Nasal Congestion    Tested positive for Covid with a home test. Need clinic test for work. - Entered by patient    HPI Madison Morgan is a 51 y.o. female.   HPI Patient presents today for COVID testing.  Patient's daughter and spouse tested positive for COVID and she has been at home caring for them.  She is tested consistently at home over the last several days and tested positive yesterday.  She only developed symptoms of nasal congestion today.  She is absent of any other URI symptoms.Needs a confirmatory test to known whether or not to return to work .  She denies any chest pain, shortness of breath or generalized weakness.  She has had no fever. 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   Tobacco use disorder 06/06/2019   Lymphedema 05/05/2019   Leg pain 05/05/2019   GAD (generalized anxiety disorder) 05/02/2019   Panic attacks 05/02/2019   MDD (major depressive disorder), recurrent episode, moderate (HCC) 04/20/2019   Abnormal uterine bleeding (AUB) 12/01/2018   Chronic pain syndrome 07/01/2018   Mild obesity 05/05/2016   COPD (chronic obstructive pulmonary disease) (HCC) 01/31/2014   EBV infection 01/31/2014   Fatigue 01/31/2014   Hyperlipidemia 01/31/2014   Major depression, recurrent, chronic (HCC) 10/08/2012   Depression 09/20/2012    Past Surgical History:  Procedure Laterality Date   BREAST BIOPSY Right 2012   negative, stereotactic biopsy   foot sugery Bilateral     OB History   No obstetric history on file.      Home Medications    Prior to Admission medications   Medication Sig Start Date  End Date Taking? Authorizing Provider  atorvastatin (LIPITOR) 20 MG tablet Take by mouth. 02/02/18   [provider]  Cholecalciferol 100 MCG (4000 UT) TABS Take by mouth.    [provider]  HYDROcodone-acetaminophen (NORCO) 10-325 MG tablet Take by mouth. 12/23/18   [provider]  lamoTRIgine (LAMICTAL) 25 MG tablet  07/25/19   [provider]  ondansetron (ZOFRAN-ODT) 4 MG disintegrating tablet Take 1 tablet (4 mg total) by mouth every 8 (eight) hours as needed for nausea or vomiting. 01/03/20   Tommie Sams, DO  RABEprazole (ACIPHEX) 20 MG tablet Take by mouth. 02/01/18 07/28/19  [provider]  triamterene-hydrochlorothiazide (MAXZIDE) 75-50 MG tablet Take by mouth. 03/08/19 03/07/20  [provider]  lurasidone (LATUDA) 20 MG TABS tablet Take 1 tablet (20 mg total) by mouth daily with supper. For depression Patient not taking: Reported on 07/28/2019 07/06/19 01/03/20  Jomarie Longs, MD  mirtazapine (REMERON) 45 MG tablet Take 1 tablet (45 mg total) by mouth at bedtime. Patient not taking: Reported on 07/28/2019 05/02/19 01/03/20  Jomarie Longs, MD  propranolol (INDERAL) 10 MG tablet Take 1 tablet (10 mg total) by mouth 2 (two) times daily as needed. For severe anxiety attacks 03/22/19 01/03/20  Jomarie Longs, MD  sertraline (ZOLOFT) 50 MG tablet Take 50 mg by mouth daily. 07/25/19 01/03/20  [provider]    Family  History Family History  Problem Relation Age of Onset   Depression Father    Breast cancer Neg Hx     Social History Social History   Tobacco Use   Smoking status: Every Day    Packs/day: 1.00    Types: Cigarettes   Smokeless tobacco: Never  Vaping Use   Vaping Use: Never used  Substance Use Topics   Alcohol use: No   Drug use: No     Allergies   Patient has no known allergies.   Review of Systems Review of Systems Pertinent negatives listed in HPI  Physical Exam Triage Vital Signs ED Triage  Vitals  Enc Vitals Group     BP      Pulse      Resp      Temp      Temp src      SpO2      Weight      Height      Head Circumference      Peak Flow      Pain Score      Pain Loc      Pain Edu?      Excl. in GC?    No data found.  Updated Vital Signs BP 110/74   Pulse 70   Temp 98.3 F (36.8 C)   Resp 18   LMP 06/18/2015   SpO2 96%   Visual Acuity Right Eye Distance:   Left Eye Distance:   Bilateral Distance:    Right Eye Near:   Left Eye Near:    Bilateral Near:     Physical Exam General Appearance:    Alert, cooperative, no distress  HENT:   ENT exam normal, no neck nodes or sinus tenderness  Eyes:    PERRL, conjunctiva/corneas clear, EOM's intact       Lungs:     Clear to auscultation bilaterally, respirations unlabored  Heart:    Regular rate and rhythm  Neurologic:   Awake, alert, oriented x 3. No apparent focal neurological           defect.         UC Treatments / Results  Labs (all labs ordered are listed, but only abnormal results are displayed) Labs Reviewed  SARS CORONAVIRUS 2 BY RT PCR    EKG   Radiology No results found.  Procedures Procedures (including critical care time)  Medications Ordered in UC Medications - No data to display  Initial Impression / Assessment and Plan / UC Course  I have reviewed the triage vital signs and the nursing notes.  Pertinent labs & imaging results that were available during my care of the patient were reviewed by me and considered in my medical decision making (see chart for details).    Positive self administer antigen test, PCR COVID test pending to confirm diagnosis.  Advised that results will update immediately to MyChart.  Our office will only contact patient if COVID test is positive.  Work note provided.  Discussed symptomatic management. Red Flag precaution provided. Return as needed.  Final Clinical Impressions(s) / UC Diagnoses   Final diagnoses:  Positive self-administered antigen  test for COVID-19     Discharge Instructions      Your COVID test will result anytime after 10 PM today.  Your results will update directly to your MyChart.  As discussed I have provided you with 2 work note if your results are negative you may return to work tomorrow as long as  you are afebrile.  If your results are positive you will need to quarantine for total of 5 days and I have also provided you with a return to work note for next Monday.  Positive results will receive a follow-up call from our clinic.  Continue over-the-counter antihistamines and decongestants, if symptoms become worrisome.  If you develop any symptoms of chest pain, shortness of breath or generalized weakness this is indication to go to the emergency department.     ED Prescriptions   None    PDMP not reviewed this encounter.   Bing Neighbors, FNP 04/30/22 1700

## 2022-04-30 NOTE — ED Triage Notes (Signed)
Pt reports Husband and Daughter tested positive for COVID . Pt reports congestion. Pt needs a COVID test to return to work.

## 2022-04-30 NOTE — Discharge Instructions (Addendum)
Your COVID test will result anytime after 10 PM today.  Your results will update directly to your MyChart.  As discussed I have provided you with 2 work note if your results are negative you may return to work tomorrow as long as you are afebrile.  If your results are positive you will need to quarantine for total of 5 days and I have also provided you with a return to work note for next Monday.  Positive results will receive a follow-up call from our clinic.  Continue over-the-counter antihistamines and decongestants, if symptoms become worrisome.  If you develop any symptoms of chest pain, shortness of breath or generalized weakness this is indication to go to the emergency department.

## 2022-07-21 ENCOUNTER — Encounter (INDEPENDENT_AMBULATORY_CARE_PROVIDER_SITE_OTHER): Payer: Self-pay

## 2022-10-08 IMAGING — MG MM DIGITAL SCREENING BILAT W/ TOMO AND CAD
8 series · 9 of 24 positions shown · non-contrast
Comparison: Previous exam(s).

CLINICAL DATA: Screening.

EXAM:
DIGITAL SCREENING BILATERAL MAMMOGRAM WITH TOMOSYNTHESIS AND CAD
TECHNIQUE: Bilateral screening digital craniocaudal and mediolateral oblique
mammograms were obtained. Bilateral screening digital breast
tomosynthesis was performed. The images were evaluated with
computer-aided detection.

[L MLO synth-2D]
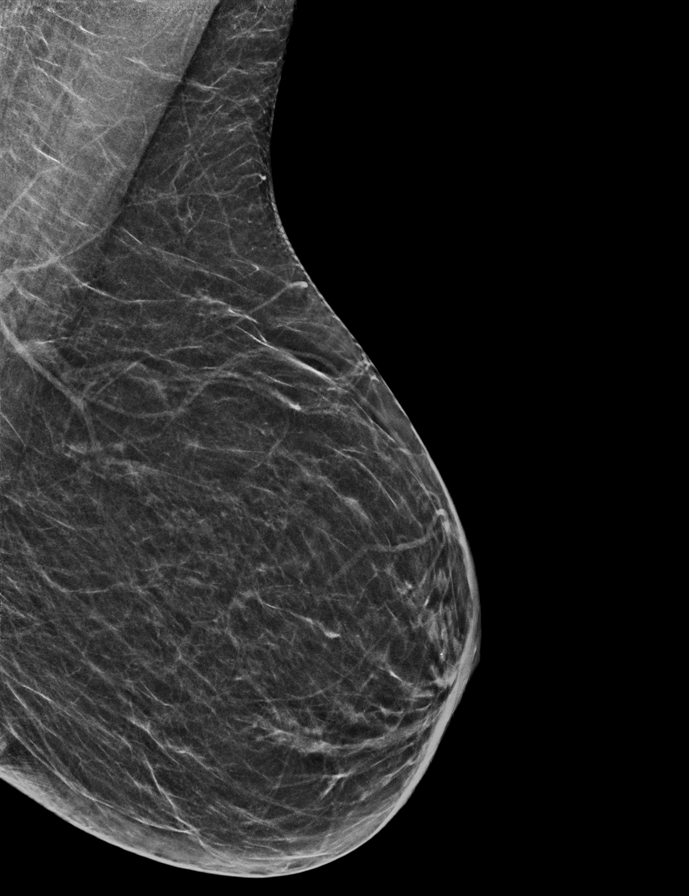

[R MLO synth-2D]
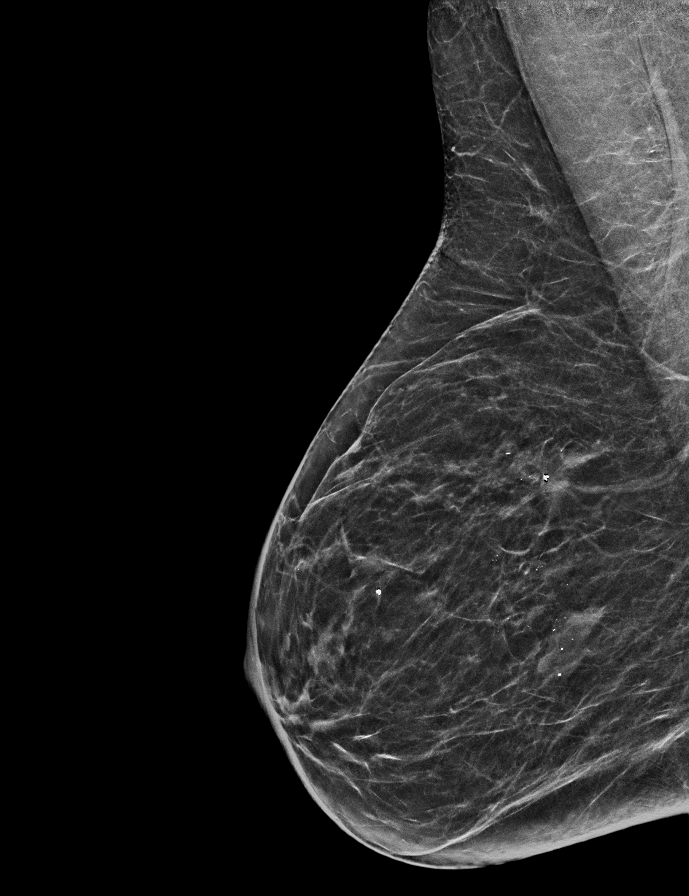

[L CC synth-2D]
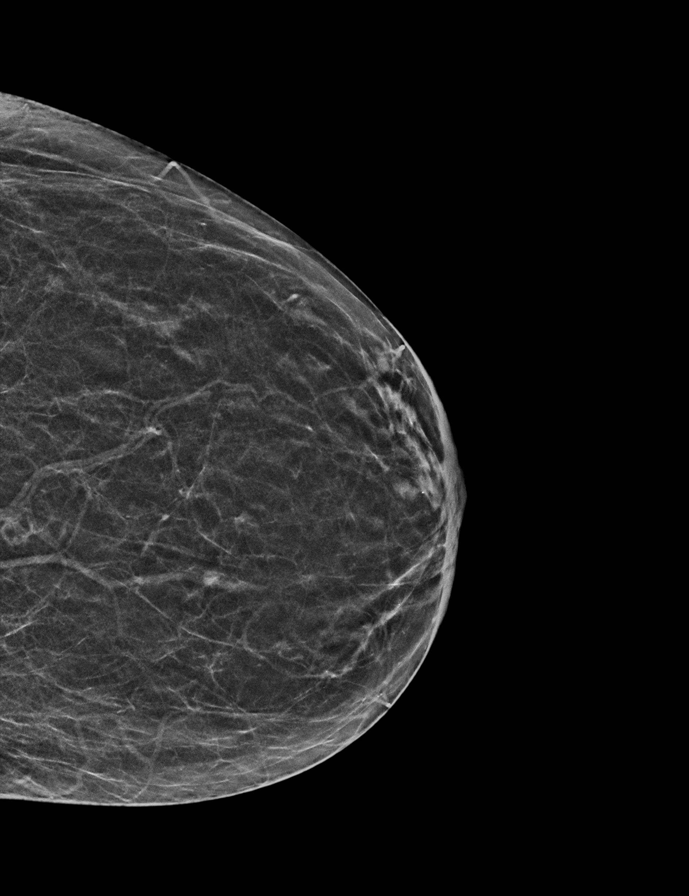

[R CC synth-2D]
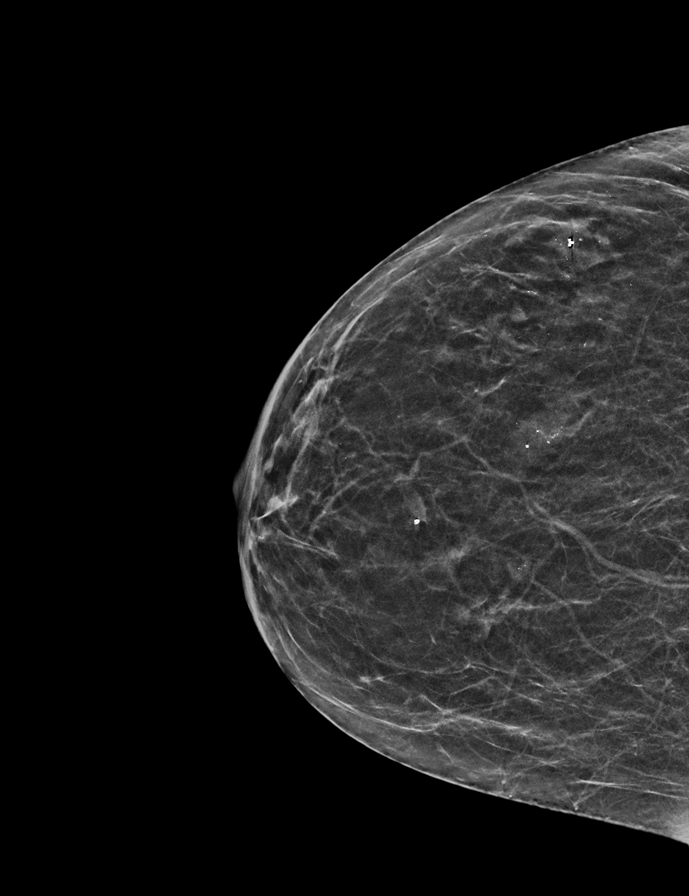

[R MLO tomo · 2 of 50 frames shown]
[frame 17/50]
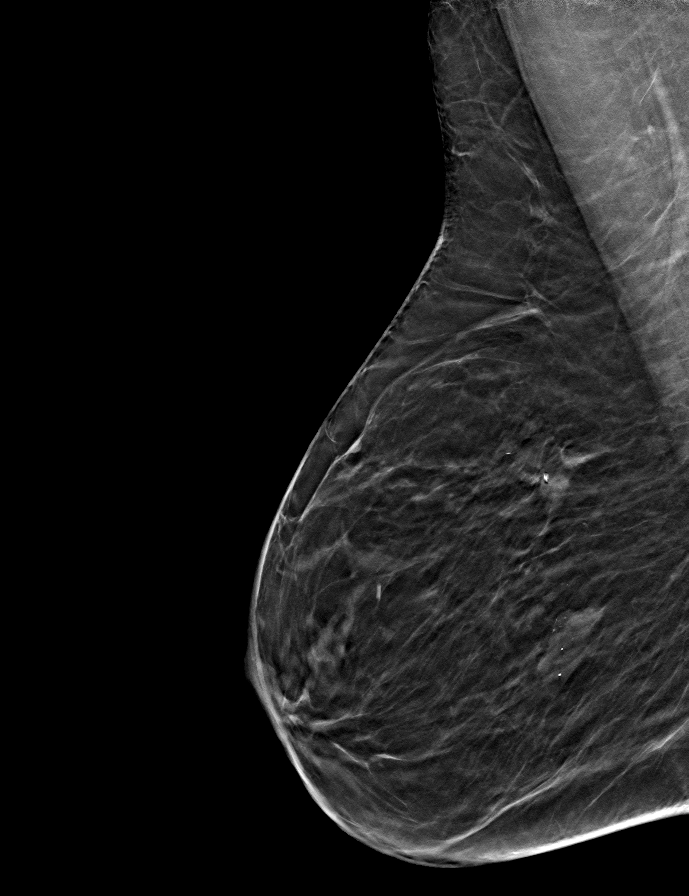
[frame 25/50]
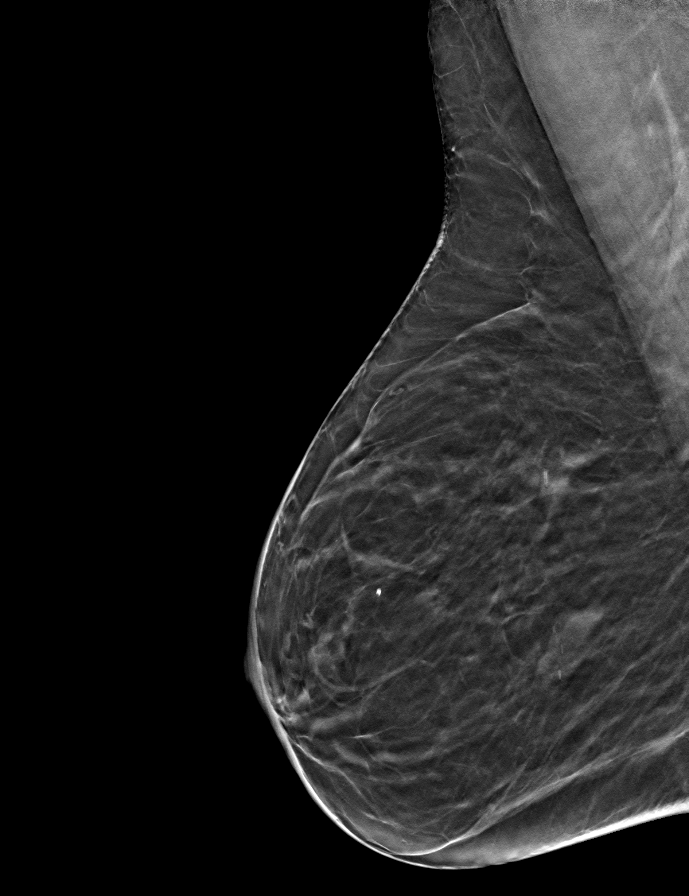

[L CC tomo · tomo slice 23/44.0]
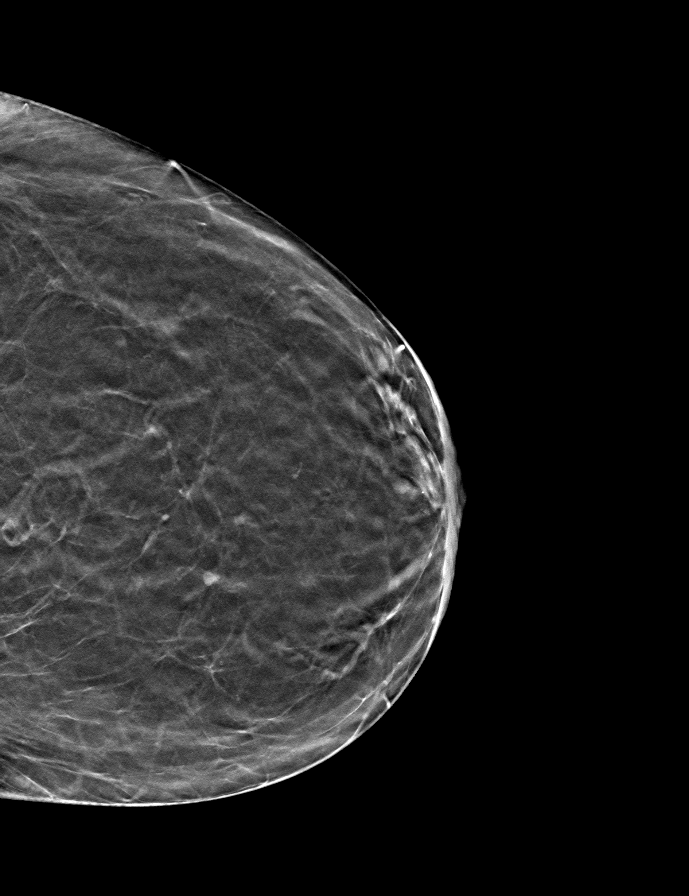

[L MLO tomo · tomo slice 24/47.0]
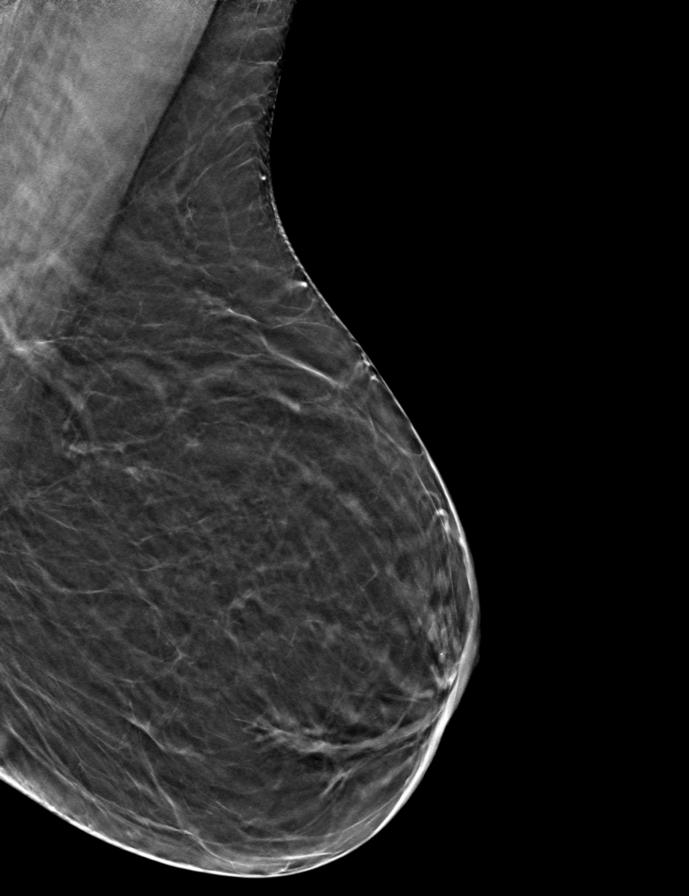

[R CC tomo · tomo slice 23/44.0]
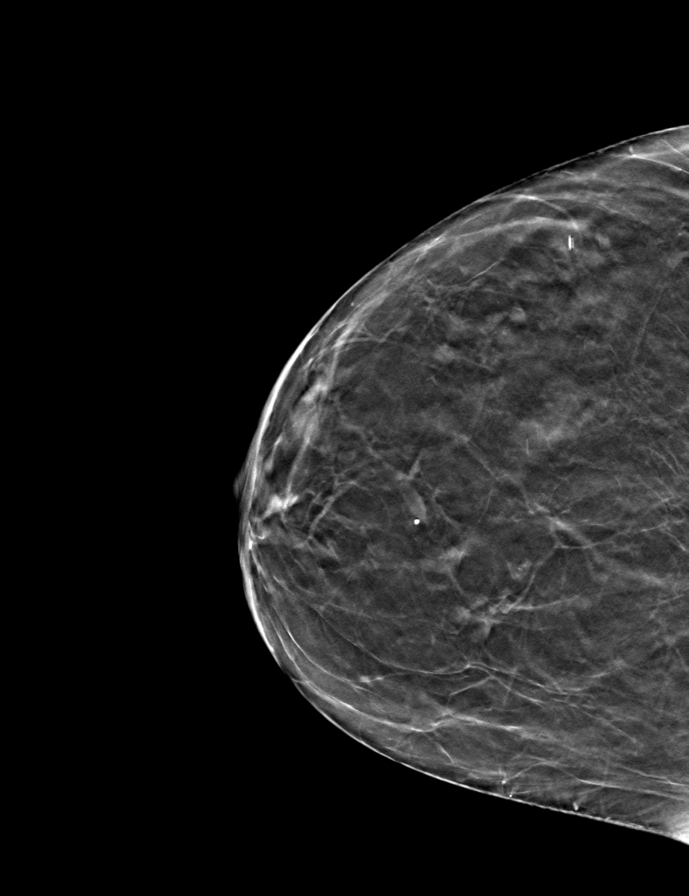

[9 of 24 positions shown; findings below may reference images not displayed]

ACR Breast Density Category b: There are scattered areas of
fibroglandular density.
FINDINGS: There are no findings suspicious for malignancy.
IMPRESSION: No mammographic evidence of malignancy. A result letter of this
screening mammogram will be mailed directly to the patient.

RECOMMENDATION:
Screening mammogram in one year. (Code:51-O-LD2)

BI-RADS CATEGORY  1: Negative.

## 2022-12-12 ENCOUNTER — Other Ambulatory Visit: Payer: Self-pay | Admitting: Internal Medicine

## 2022-12-12 DIAGNOSIS — Z1231 Encounter for screening mammogram for malignant neoplasm of breast: Secondary | ICD-10-CM

## 2023-02-04 ENCOUNTER — Encounter: Payer: Self-pay | Admitting: Internal Medicine

## 2023-02-04 DIAGNOSIS — Z1231 Encounter for screening mammogram for malignant neoplasm of breast: Secondary | ICD-10-CM

## 2023-03-03 ENCOUNTER — Ambulatory Visit
Admission: RE | Admit: 2023-03-03 | Discharge: 2023-03-03 | Disposition: A | Discharge status: Home / Self Care | Payer: BC Managed Care – PPO | Source: Ambulatory Visit | Attending: Internal Medicine | Admitting: Internal Medicine

## 2023-03-03 DIAGNOSIS — Z1231 Encounter for screening mammogram for malignant neoplasm of breast: Secondary | ICD-10-CM | POA: Insufficient documentation

## 2023-07-01 ENCOUNTER — Ambulatory Visit: Payer: BC Managed Care – PPO

## 2023-07-01 DIAGNOSIS — Z1211 Encounter for screening for malignant neoplasm of colon: Secondary | ICD-10-CM | POA: Diagnosis present

## 2023-07-01 DIAGNOSIS — K640 First degree hemorrhoids: Secondary | ICD-10-CM | POA: Diagnosis not present

## 2023-07-01 DIAGNOSIS — Z83719 Family history of colon polyps, unspecified: Secondary | ICD-10-CM | POA: Diagnosis not present

## 2023-07-01 DIAGNOSIS — K5730 Diverticulosis of large intestine without perforation or abscess without bleeding: Secondary | ICD-10-CM | POA: Diagnosis not present

## 2023-09-15 ENCOUNTER — Encounter (INDEPENDENT_AMBULATORY_CARE_PROVIDER_SITE_OTHER): Payer: Self-pay

## 2023-09-16 ENCOUNTER — Other Ambulatory Visit: Payer: Self-pay | Admitting: Medical Genetics

## 2023-09-19 ENCOUNTER — Other Ambulatory Visit: Payer: Self-pay | Attending: Medical Genetics

## 2023-11-25 ENCOUNTER — Ambulatory Visit (INDEPENDENT_AMBULATORY_CARE_PROVIDER_SITE_OTHER)

## 2023-11-25 ENCOUNTER — Ambulatory Visit
Admission: RE | Admit: 2023-11-25 | Discharge: 2023-11-25 | Disposition: A | Discharge status: Home / Self Care | Payer: Self-pay | Source: Ambulatory Visit

## 2023-11-25 VITALS — BP 127/78 | HR 83 | Temp 98.6°F | Resp 17

## 2023-11-25 DIAGNOSIS — R200 Anesthesia of skin: Secondary | ICD-10-CM | POA: Diagnosis not present

## 2023-11-25 DIAGNOSIS — R202 Paresthesia of skin: Secondary | ICD-10-CM

## 2023-11-25 DIAGNOSIS — M5030 Other cervical disc degeneration, unspecified cervical region: Secondary | ICD-10-CM

## 2023-11-25 DIAGNOSIS — M5412 Radiculopathy, cervical region: Secondary | ICD-10-CM

## 2023-11-25 DIAGNOSIS — M25512 Pain in left shoulder: Secondary | ICD-10-CM | POA: Diagnosis not present

## 2023-11-25 DIAGNOSIS — G894 Chronic pain syndrome: Secondary | ICD-10-CM | POA: Diagnosis not present

## 2023-11-25 MED ORDER — KETOROLAC TROMETHAMINE 30 MG/ML IJ SOLN
30.0000 mg | Freq: Once | INTRAMUSCULAR | Status: AC
  Administered 2023-11-25: 30 mg via INTRAMUSCULAR

## 2023-11-25 MED ORDER — PREDNISONE 10 MG PO TABS: ORAL_TABLET | ORAL | 0 refills | Status: DC

## 2023-11-25 NOTE — ED Triage Notes (Signed)
 Sx x 1 month   Fingers numb, severe pain and tingling from neck through shoulder, down to fingers. Worse pain is at bicep. Feels like it's being twisted as tight as possible.   Left arm

## 2023-11-25 NOTE — Discharge Instructions (Addendum)
-  I will call you with results of Xray. You should make an appt with orthopedics right away.  NECK PAIN: Presentation consistent with pinched nerve. Stressed avoiding painful activities. This can exacerbate your symptoms and make them worse.  May apply heat to the areas of pain for some relief. Use medications as directed. Be aware of which medications make you drowsy and do not drive or operate any kind of heavy machinery while using the medication (ie pain medications or muscle relaxers). F/U with PCP for reexamination or return sooner if condition worsens or does not begin to improve over the next few days.   NECK PAIN RED FLAGS: If symptoms get worse than they are right now, you should  go to Er. If you have increased numbness/ tingling or notice that the numbness/tingling is affecting the legs or saddle region, go to ER. If you ever lose continence go to ER.      You have a condition requiring you to follow up with Orthopedics so please call one of the following office for appointment:   [facility] Address: [address], [place], [place] [address] Phone: [phone]  [facility] [address], [place], [place] [address] Phone: [phone]  [facility] [address], [place], [place] [address] Phone: [phone]

## 2023-11-25 NOTE — ED Provider Notes (Signed)
 [facility]    CSN: [id number] Arrival date & time: 11/25/23  0803      History   Chief Complaint Chief Complaint  Patient presents with   Shoulder Pain    Fingers numb, severe pain and tingling from neck through shoulder, down to fingers. Worse pain is at bicep. Feels like it's being twisted as tight as possible. - Entered by patient    HPI Madison Morgan is a 53 y.o. female presenting for approximately 1 month history of left-sided neck pain with radiation down the left arm.  Most pain is located at the biceps and upper arm.  Reports occasional numbness and tingling of the thumb and index finger.  Over the past 1 week the pain has gotten worse and she started to have increased numbness in her thumb.  Reports dropping something last week when she tried to pick it up with her left hand.  Symptoms improved a little when she raises her shoulder and worsen when she extends her neck or turns neck toward the affected side.  Patient's medical history significant for chronic back pain and fibromyalgia.  She takes Norco daily and says that is not even helping.  Has not tried any NSAIDs.  Denies any history of neck or shoulder issues.  No other complaints or concerns.  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   Tobacco use disorder 06/06/2019   Lymphedema 05/05/2019   Leg pain 05/05/2019   GAD (generalized anxiety disorder) 05/02/2019   Panic attacks 05/02/2019   MDD (major depressive disorder), recurrent episode, moderate (HCC) 04/20/2019   Abnormal uterine bleeding (AUB) 12/01/2018   Chronic pain syndrome 07/01/2018   Mild obesity 05/05/2016   COPD (chronic obstructive pulmonary disease) (HCC) 01/31/2014   EBV infection 01/31/2014   Fatigue 01/31/2014    Hyperlipidemia 01/31/2014   Major depression, recurrent, chronic (HCC) 10/08/2012   Depression 09/20/2012    Past Surgical History:  Procedure Laterality Date   BREAST BIOPSY Right 2012   negative, stereotactic biopsy   foot sugery Bilateral     OB History   No obstetric history on file.      Home Medications    Prior to Admission medications   Medication Sig Start Date End Date Taking? Authorizing Provider  atorvastatin (LIPITOR) 20 MG tablet Take by mouth. 02/02/18  Yes [provider]  BOTOX 100 units SOLR injection Inject into the muscle. 08/10/23  Yes [provider]  buPROPion (WELLBUTRIN SR) 150 MG 12 hr tablet Take by mouth.   Yes [provider]  escitalopram (LEXAPRO) 20 MG tablet Take 20 mg by mouth daily.   Yes [provider]  predniSONE (DELTASONE) 10 MG tablet Take 5 tabs p.o. x 2 days, 4 tabs x 2 days, 3 tabs x 2 days, 2 tabs x 2 days, 1 tab x 2 days 11/25/23  Yes Shirlee Latch, PA-C  Cholecalciferol 100 MCG (4000 UT) TABS Take by mouth.    [provider]  HYDROcodone-acetaminophen (NORCO) 10-325 MG tablet Take by mouth. 12/23/18   [provider]  lamoTRIgine (LAMICTAL) 25 MG tablet  07/25/19   [provider]  ondansetron (ZOFRAN-ODT) 4 MG disintegrating tablet Take 1 tablet (4 mg  total) by mouth every 8 (eight) hours as needed for nausea or vomiting. 01/03/20   Tommie Sams, DO  RABEprazole (ACIPHEX) 20 MG tablet Take by mouth. 02/01/18 07/28/19  [provider]  triamterene-hydrochlorothiazide (MAXZIDE) 75-50 MG tablet Take by mouth. 03/08/19 03/07/20  [provider]  lurasidone (LATUDA) 20 MG TABS tablet Take 1 tablet (20 mg total) by mouth daily with supper. For depression Patient not taking: Reported on 07/28/2019 07/06/19 01/03/20  Jomarie Longs, MD  mirtazapine (REMERON) 45 MG tablet Take 1 tablet (45 mg total) by mouth at bedtime. Patient not taking: Reported on 07/28/2019 05/02/19  01/03/20  Jomarie Longs, MD  propranolol (INDERAL) 10 MG tablet Take 1 tablet (10 mg total) by mouth 2 (two) times daily as needed. For severe anxiety attacks 03/22/19 01/03/20  Jomarie Longs, MD  sertraline (ZOLOFT) 50 MG tablet Take 50 mg by mouth daily. 07/25/19 01/03/20  [provider]    Family History Family History  Problem Relation Age of Onset   Depression Father    Breast cancer Neg Hx     Social History Social History   Tobacco Use   Smoking status: Every Day    Current packs/day: 1.00    Types: Cigarettes   Smokeless tobacco: Never  Vaping Use   Vaping status: Never Used  Substance Use Topics   Alcohol use: No   Drug use: No     Allergies   Patient has no known allergies.   Review of Systems Review of Systems  Musculoskeletal:  Positive for arthralgias, neck pain and neck stiffness. Negative for back pain.  Neurological:  Positive for weakness and numbness.     Physical Exam Triage Vital Signs ED Triage Vitals  Encounter Vitals Group     BP 11/25/23 0821 (!) 120/54     Systolic BP Percentile --      Diastolic BP Percentile --      Pulse Rate 11/25/23 0821 83     Resp 11/25/23 0821 17     Temp 11/25/23 0821 98.6 F (37 C)     Temp Source 11/25/23 0821 Oral     SpO2 11/25/23 0821 97 %     Weight --      Height --      Head Circumference --      Peak Flow --      Pain Score 11/25/23 0820 7     Pain Loc --      Pain Education --      Exclude from Growth Chart --    No data found.  Updated Vital Signs BP 127/78 (BP Location: Right Arm)   Pulse 83   Temp 98.6 F (37 C) (Oral)   Resp 17   LMP 06/18/2015   SpO2 97%     Physical Exam Vitals and nursing note reviewed.  Constitutional:      General: She is not in acute distress.    Appearance: Normal appearance. She is not ill-appearing or toxic-appearing.  HENT:     Head: Normocephalic and atraumatic.  Eyes:     General: No scleral icterus.       Right eye: No discharge.         Left eye: No discharge.     Conjunctiva/sclera: Conjunctivae normal.  Cardiovascular:     Rate and Rhythm: Normal rate and regular rhythm.     Heart sounds: Normal heart sounds.  Pulmonary:     Effort: Pulmonary effort is normal. No respiratory distress.  Musculoskeletal:     Left shoulder: No tenderness or bony tenderness. Normal range of motion. Normal strength.     Cervical back: Neck supple. No tenderness or bony tenderness. Pain with movement (extension, rotation to the left) present.     Comments: 4/5 grip strength on left compared to 5/5 on right. Other upper body strength is 5/5 bilaterally  Skin:    General: Skin is dry.  Neurological:     General: No focal deficit present.     Mental Status: She is alert. Mental status is at baseline.     Motor: No weakness.     Gait: Gait normal.  Psychiatric:        Mood and Affect: Mood normal.        Behavior: Behavior normal.      UC Treatments / Results  Labs (all labs ordered are listed, but only abnormal results are displayed) Labs Reviewed - No data to display  EKG   Radiology DG Cervical Spine Complete Result Date: 11/25/2023 CLINICAL DATA:  neck and left arm pain. numbness/tingling thumb x 1 mont EXAM: CERVICAL SPINE - COMPLETE 4+ VIEW COMPARISON:  None FINDINGS: No fracture or dislocation. No prevertebral soft tissue swelling. Straightening of the normal cervical lordosis. Moderate narrowing of the C5-6 interspace, mild narrowing C4-5 and C6-7. No significant osseous encroachment of the neural foramina. Dental restorations. IMPRESSION: 1. No acute findings. 2. Multilevel degenerative disc disease, most notable at C5-6. Electronically Signed   By: Corlis Leak M.D.   On: 11/25/2023 11:06    Procedures Procedures (including critical care time)  Medications Ordered in UC Medications  ketorolac (TORADOL) 30 MG/ML injection 30 mg (30 mg Intramuscular Given 11/25/23 0839)    Initial Impression / Assessment and Plan / UC  Course  I have reviewed the triage vital signs and the nursing notes.  Pertinent labs & imaging results that were available during my care of the patient were reviewed by me and considered in my medical decision making (see chart for details).   53 year old female presents for left-sided neck pain with radiation down the left arm, associated severe pain in the left upper arm and numbness and tingling in the left thumb and index finger x 1 month.  Denies injury.  On exam she has no tenderness but has increased discomfort in her arm when she rotates her neck to the left or extends neck.  Full range of motion of shoulder.  5 out of 5 strength bilateral upper extremities except for grip strength which is 4/5 on the left.  Presentation distant with cervical radiculopathy at C6.  X-ray of C-spine obtained today.  Will call patient with results but explained she needs an MRI performed by orthopedics.  Patient has history of chronic pain and takes Norco daily.  Advised to continue Norco.  Will also treat with corticosteroid taper and patient given 30 mg IM ketorolac injection in clinic.  Information given for Ortho follow-up.  C-spine x-ray shows multilevel degenerative disc disease, most notable at C5-C6.  I contacted patient to discuss her results with her.  Advised her to make a follow-up appoint with orthopedics that she will need to have an MRI to evaluate further.  Thoroughly reviewed return and ER precautions related to neck pain/pinched nerves.   Final Clinical Impressions(s) / UC Diagnoses   Final diagnoses:  Cervical radiculopathy at C6  Acute pain of left shoulder  Numbness and tingling of left thumb  Chronic pain syndrome  DDD (degenerative disc disease),  cervical     Discharge Instructions      -I will call you with results of Xray. You should make an appt with orthopedics right away.  NECK PAIN: Presentation consistent with pinched nerve. Stressed avoiding painful activities. This  can exacerbate your symptoms and make them worse.  May apply heat to the areas of pain for some relief. Use medications as directed. Be aware of which medications make you drowsy and do not drive or operate any kind of heavy machinery while using the medication (ie pain medications or muscle relaxers). F/U with PCP for reexamination or return sooner if condition worsens or does not begin to improve over the next few days.   NECK PAIN RED FLAGS: If symptoms get worse than they are right now, you should  go to Er. If you have increased numbness/ tingling or notice that the numbness/tingling is affecting the legs or saddle region, go to ER. If you ever lose continence go to ER.      You have a condition requiring you to follow up with Orthopedics so please call one of the following office for appointment:   [facility] Address: [address] [place], [place] [address] Phone: [phone]  [facility] [address] [place], [place] [address] Phone: [phone]  [facility] [address], [place], [place] [address] Phone: [phone]      ED Prescriptions     Medication Sig Dispense Auth. Provider   predniSONE (DELTASONE) 10 MG tablet Take 5 tabs p.o. x 2 days, 4 tabs x 2 days, 3 tabs x 2 days, 2 tabs x 2 days, 1 tab x 2 days 30 tablet Shirlee Latch, PA-C      I have reviewed the PDMP during this encounter.   Shirlee Latch, PA-C 11/25/23 1242

## 2023-11-30 ENCOUNTER — Ambulatory Visit

## 2023-11-30 ENCOUNTER — Ambulatory Visit
Admission: RE | Admit: 2023-11-30 | Discharge: 2023-11-30 | Disposition: A | Discharge status: Home / Self Care | Source: Ambulatory Visit | Attending: Family Medicine | Admitting: Family Medicine

## 2023-11-30 ENCOUNTER — Other Ambulatory Visit: Payer: Self-pay | Admitting: Family Medicine

## 2023-11-30 DIAGNOSIS — G959 Disease of spinal cord, unspecified: Secondary | ICD-10-CM | POA: Insufficient documentation

## 2023-12-01 ENCOUNTER — Encounter: Payer: Self-pay | Admitting: Neurosurgery

## 2023-12-01 ENCOUNTER — Ambulatory Visit: Admitting: Neurosurgery

## 2023-12-01 VITALS — BP 128/76 | Ht 65.0 in | Wt 159.0 lb

## 2023-12-01 DIAGNOSIS — M4802 Spinal stenosis, cervical region: Secondary | ICD-10-CM

## 2023-12-01 DIAGNOSIS — G5132 Clonic hemifacial spasm, left: Secondary | ICD-10-CM

## 2023-12-01 DIAGNOSIS — M50122 Cervical disc disorder at C5-C6 level with radiculopathy: Secondary | ICD-10-CM | POA: Diagnosis not present

## 2023-12-01 DIAGNOSIS — M5412 Radiculopathy, cervical region: Secondary | ICD-10-CM

## 2023-12-01 NOTE — Progress Notes (Signed)
 Referring Physician:  Denton Lank, FNP [address] [place],  [place] [address]  Primary Physician:  Marguarite Arbour, MD  History of Present Illness: 12/01/2023 [name] is here today with a chief complaint of left arm pain, weakness, and tingling. The symptoms started about a month ago with tingling, and two weeks ago, the patient began experiencing pain. The pain extends from the neck down to the thumb and index finger of the left hand. The patient reports that the pain is bearable at times and unbearable at others, but does not feel it is getting worse. The patient has tried hydrocodone and prednisone for symptom management, but reports no significant improvement. The patient denies any precipitating events or similar past experiences. The patient also reports discomfort in the left shoulder blade area and a burning sensation in the left arm.     Bowel/Bladder Dysfunction: none  Conservative measures:  Physical therapy:  has not participated in PT Multimodal medical therapy including regular antiinflammatories:  Hydrocodone, Prednisone,  Injections: no epidural steroid injections  Past Surgery: none  [name] has no symptoms of cervical myelopathy.  The symptoms are causing a significant impact on the patient's life.   I have utilized the care everywhere function in epic to review the outside records available from external health systems.  Review of Systems:  A 10 point review of systems is negative, except for the pertinent positives and negatives detailed in the HPI.  Past Medical History: Past Medical History:  Diagnosis Date   Anxiety    Chronic back pain    Depression    Fibromyalgia    Hyperlipemia    currently taking lipitor    Past Surgical History: Past Surgical History:  Procedure Laterality Date   BREAST BIOPSY Right 2012   negative, stereotactic biopsy   foot sugery Bilateral     Allergies: Allergies as of 12/01/2023    (No Known Allergies)    Medications:  Current Outpatient Medications:    atorvastatin (LIPITOR) 20 MG tablet, Take by mouth., Disp: , Rfl:    BOTOX 100 units SOLR injection, Inject into the muscle., Disp: , Rfl:    buPROPion (WELLBUTRIN SR) 150 MG 12 hr tablet, Take by mouth., Disp: , Rfl:    Cholecalciferol 100 MCG (4000 UT) TABS, Take by mouth., Disp: , Rfl:    escitalopram (LEXAPRO) 20 MG tablet, Take 20 mg by mouth daily., Disp: , Rfl:    gabapentin (NEURONTIN) 100 MG capsule, 1 po qHS, Disp: , Rfl:    HYDROcodone-acetaminophen (NORCO) 10-325 MG tablet, Take by mouth., Disp: , Rfl:    predniSONE (DELTASONE) 10 MG tablet, Take 5 tabs p.o. x 2 days, 4 tabs x 2 days, 3 tabs x 2 days, 2 tabs x 2 days, 1 tab x 2 days, Disp: 30 tablet, Rfl: 0   triamterene-hydrochlorothiazide (MAXZIDE) 75-50 MG tablet, Take by mouth., Disp: , Rfl:    RABEprazole (ACIPHEX) 20 MG tablet, Take by mouth., Disp: , Rfl:   Social History: Social History   Tobacco Use   Smoking status: Former    Current packs/day: 0.00    Types: Cigarettes    Quit date: 05/2021    Years since quitting: 2.5   Smokeless tobacco: Never  Vaping Use   Vaping status: Never Used  Substance Use Topics   Alcohol use: No   Drug use: No    Family Medical History: Family History  Problem Relation Age of Onset   Depression  Father    Breast cancer Neg Hx     Physical Examination: Vitals:   12/01/23 1536  BP: 128/76    General: Patient is in no apparent distress. Attention to examination is appropriate.  Neck:   Supple.  Full range of motion with discomfort on extension and rotation to left .  Respiratory: Patient is breathing without any difficulty.   NEUROLOGICAL:     Awake, alert, oriented to person, place, and time.  Speech is clear and fluent.   Cranial Nerves: Pupils equal round and reactive to light.  Facial tone is symmetric.  Facial sensation is symmetric. Shoulder shrug is symmetric. Tongue protrusion is  midline.  There is no pronator drift.  Strength: Side Biceps Triceps Deltoid Interossei Grip Wrist Ext. Wrist Flex.  R 5 5 5 5 5 5 5   L 5 5 5 5 5 5 5    Side Iliopsoas Quads Hamstring PF DF EHL  R 5 5 5 5 5 5   L 5 5 5 5 5 5    Reflexes are 1+ and symmetric at the biceps, triceps, brachioradialis, patella and achilles.   Hoffman's is absent.   Bilateral upper and lower extremity sensation is intact to light touch.    No evidence of dysmetria noted.  Gait is normal.     Medical Decision Making  Imaging: MRI C spine 11/30/2023 IMPRESSION: 1. 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 2. C6-7 disc degeneration with moderate left foraminal stenosis from uncovertebral spur and disc height loss. 3. Levels of facet degeneration described above.   Electronically Signed: By: Tiburcio Pea M.D. On: 12/01/2023 08:11 ADDENDUM: Request made for quantification of the degree of impingement at C5-6. Impingement is advanced with herniation causing broad compression of the intradural left C6 nerve root. This herniation extends towards the left foraminal entry of C5-6.     Electronically Signed   By: Tiburcio Pea M.D.   On: 12/01/2023 11:11  I have personally reviewed the images and agree with the above interpretation.  Assessment and Plan: Madison Morgan is a pleasant 53 y.o. female with cervical radiculopathy most consistent with left C6 distribution.    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 Symptoms include left arm pain, tingling, and weakness. Conservative management advised due to moderate severity and stable symptoms. - Initiate physical therapy at outpatient department. - Prescribe NSAIDs: ibuprofen 3 pills TID or naproxen 2 pills BID. - Consider referral for pain management injection with Dr. Yves Dill. - Advise activity  modification: avoid lifting >10 lbs, use backpack for work Proofreader. - Schedule follow-up in 8 weeks to reassess.  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 Botox ineffective. Discussed surgical options for refractory cases impacting quality of life. - Discuss surgical options if symptoms persist and are bothersome.      I spent a total of 30 minutes in this patient's care today. This time was spent reviewing pertinent records including imaging studies, obtaining and confirming history, performing a directed evaluation, formulating and discussing my recommendations, and documenting the visit within the medical record.    Thank you for involving me in the care of this patient.      Shivonne Schwartzman K. Myer Haff MD, [facility]

## 2023-12-03 ENCOUNTER — Ambulatory Visit: Admitting: Physical Therapy

## 2023-12-08 ENCOUNTER — Encounter: Admitting: Physical Therapy

## 2023-12-10 ENCOUNTER — Ambulatory Visit: Admitting: Physical Therapy

## 2023-12-14 ENCOUNTER — Ambulatory Visit: Admitting: Physical Therapy

## 2023-12-15 ENCOUNTER — Other Ambulatory Visit: Payer: Self-pay

## 2023-12-15 ENCOUNTER — Encounter: Payer: Self-pay | Admitting: Physical Therapy

## 2023-12-15 ENCOUNTER — Ambulatory Visit: Attending: Neurosurgery | Admitting: Physical Therapy

## 2023-12-15 DIAGNOSIS — R29898 Other symptoms and signs involving the musculoskeletal system: Secondary | ICD-10-CM | POA: Diagnosis present

## 2023-12-15 DIAGNOSIS — M4802 Spinal stenosis, cervical region: Secondary | ICD-10-CM | POA: Insufficient documentation

## 2023-12-15 DIAGNOSIS — M5412 Radiculopathy, cervical region: Secondary | ICD-10-CM | POA: Insufficient documentation

## 2023-12-15 DIAGNOSIS — M542 Cervicalgia: Secondary | ICD-10-CM | POA: Diagnosis present

## 2023-12-15 DIAGNOSIS — M792 Neuralgia and neuritis, unspecified: Secondary | ICD-10-CM | POA: Diagnosis present

## 2023-12-15 NOTE — Therapy (Addendum)
 OUTPATIENT PHYSICAL THERAPY CERVICAL EVALUATION   Patient Name: Madison Morgan MRN: [id number] DOB:1971/04/25, 53 y.o., female Today's Date: 12/15/2023  END OF SESSION:  PT End of Session - 12/15/23 1447     Visit Number 1    Number of Visits 16    Date for PT Re-Evaluation 02/09/24    PT Start Time 1447    PT Stop Time 1530    PT Time Calculation (min) 43 min    Activity Tolerance Patient tolerated treatment well             Past Medical History:  Diagnosis Date   Anxiety    Chronic back pain    Depression    Fibromyalgia    Hyperlipemia    currently taking lipitor   Past Surgical History:  Procedure Laterality Date   BREAST BIOPSY Right 2012   negative, stereotactic biopsy   foot sugery Bilateral    Patient Active Problem List   Diagnosis Date Noted   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   Tobacco use disorder 06/06/2019   Lymphedema 05/05/2019   Leg pain 05/05/2019   GAD (generalized anxiety disorder) 05/02/2019   Panic attacks 05/02/2019   MDD (major depressive disorder), recurrent episode, moderate (HCC) 04/20/2019   Abnormal uterine bleeding (AUB) 12/01/2018   Chronic pain syndrome 07/01/2018   Mild obesity 05/05/2016   COPD (chronic obstructive pulmonary disease) (HCC) 01/31/2014   EBV infection 01/31/2014   Fatigue 01/31/2014   Hyperlipidemia 01/31/2014   Major depression, recurrent, chronic (HCC) 10/08/2012   Depression 09/20/2012    PCP: Marguarite Arbour, MD   REFERRING PROVIDER: Venetia Night, MD   REFERRING DIAG:  Diagnosis  M54.12 (ICD-10-CM) - Cervical radiculopathy  M48.02 (ICD-10-CM) - Cervical stenosis of spinal canal    THERAPY DIAG:  No diagnosis found.  Rationale for Evaluation and Treatment: Rehabilitation  ONSET DATE: ~ 1 month   SUBJECTIVE:                                                                                                                                                                                                          SUBJECTIVE STATEMENT: Pt reports that she has a herniated disc that is pinching a nerve on the L side. Reports that she has pins and needle pain as well as tingling and paraesthesia from the elbow running into the first and second digit on the L hand. Report that neck pain with radiculopathy for roughly a month and it got worse about 3 weeks ago following transforaminal epidural steroid injection .  Hand dominance: Right  PERTINENT HISTORY:    53 y.o. female presenting for approximately 1 month history of left-sided neck pain with radiation down the left arm.  Most pain is located at the biceps and upper arm.  Reports occasional numbness and tingling of the thumb and index finger.  Over the past 1 week the pain has gotten worse and she started to have increased numbness in her thumb.  Reports dropping something last week when she tried to pick it up with her left hand.  Symptoms improved a little when she raises her shoulder and worsen when she extends her neck or turns neck toward the affected side.  Patient's medical history significant for chronic back pain and fibromyalgia.  She takes Norco daily and says that is not even helping.  Has not tried any NSAIDs.  Denies any history of neck or shoulder issues.    PAIN:  Are you having pain? Yes: NPRS scale: 1/10 Pain location: L side of the neck.  Pain description: throbbing.  Aggravating factors: turning head to the left   Relieving factors: heat   PRECAUTIONS: Cervical  RED FLAGS: Cervical red flags: none       WEIGHT BEARING RESTRICTIONS: No  FALLS:  Has patient fallen in last 6 months? No  LIVING ENVIRONMENT: Lives with: lives with their spouse Lives in: House/apartment Stairs: Yes: External: 3 from the back  steps; on right going up and on left going up Has following equipment at home:  has can and walker for husband. Does not use  and None  OCCUPATION: courtroom clerk.   PLOF: Independent  and Independent with basic ADLs  PATIENT GOALS: decrease pain in the neck and L UE. Improved mobility in neck   NEXT MD VISIT: 5/6. Follow-up with Myer Haff.   OBJECTIVE:  Note: Objective measures were completed at Evaluation unless otherwise noted.  DIAGNOSTIC FINDINGS:  MRI MPRESSION: 1. 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 2. C6-7 disc degeneration with moderate left foraminal stenosis from uncovertebral spur and disc height loss. 3. Levels of facet degeneration described above. ADDENDUM: Request made for quantification of the degree of impingement at C5-6. Impingement is advanced with herniation causing broad compression of the intradural left C6 nerve root. This herniation extends towards the left foraminal entry of C5-6.  PATIENT SURVEYS:  NDI 10/50  COGNITION: Overall cognitive status: Within functional limits for tasks assessed  SENSATION: Light touch: Impaired  paraesthesia in the L thumb.   POSTURE: forward head and increased thoracic kyphosis  PALPATION: Multiple TP noted in upper thoracic spine and cervical spine. No increased n/t with TP palpation.    CERVICAL ROM:   Active ROM A/PROM (deg) eval  Flexion 20*  Extension 30*  Right lateral flexion 35  Left lateral flexion 40*  Right rotation 44*  Left rotation 50*   (Blank rows = not tested)  * = pain   UPPER EXTREMITYMMT  MMT Right eval Left eval  Shoulder flexion 4 4-*  Shoulder extension 4+ 4  Shoulder abduction 4 4-  Shoulder adduction 4+ 4  Shoulder extension    Shoulder internal rotation 4+ 4  Shoulder external rotation 4 4-  Elbow flexion 4+ 4+  Elbow extension 4+ 4+  Wrist flexion 4+ 4  Wrist extension 4+ 4-  Wrist ulnar deviation    Wrist radial deviation    Wrist pronation    Wrist supination     (Blank rows =  not tested)  UPPER EXTREMITY ROM: Grossly WFL  (Blank rows = not tested)  CERVICAL  SPECIAL TESTS:  Upper limb tension test (ULTT): Positive, Spurling's test: Positive, and Distraction test: Negative  FUNCTIONAL TESTS:   TREATMENT DATE:  12/15/2023                                                                                                                               Access Code: [email or web address] URL: [email or web address] Date: 12/16/2023 Prepared by: Grier Rocher  Exercises - Supine Cervical Retraction with Towel - 5 reps 3 [place] hold  - Supine Cervical Rotation Passive Unloaded  - 3 reps - 10 hold - Shoulder External Rotation and Scapular Retraction  - 10 reps  Cues to keep movement in pain free range and reduce activation to prevent UT over activation.   PATIENT EDUCATION:  Education details: POC. Benefits of PT Person educated: Patient Education method: Explanation and Handouts Education comprehension: verbalized understanding  HOME EXERCISE PROGRAM: Access Code: [email or web address] URL: [email or web address] Date: 12/16/2023 Prepared by: Grier Rocher  Exercises - Supine Cervical Retraction with Towel  - 1 x daily - 7 x weekly - 3 sets - 10 reps - Supine Cervical Rotation Passive Unloaded  - 1 x daily - 7 x weekly - 2 sets - 3 reps - 10 hold - Shoulder External Rotation and Scapular Retraction  - 1 x daily - 7 x weekly - 3 sets - 10 reps  ASSESSMENT:  CLINICAL IMPRESSION: Patient is a 53 y.o. female who was seen today for physical therapy evaluation and treatment for cervical radiculopathy. Pt reports that pain was 9/10 initially, has received pain management injection that significantly reduced localized neck pain and reduced severity of radicular pain to 1/10 at best and only 6/10 at worst with end range laterla flexion or cervical rotation. Pt was noted to have decreased strength in the LUE as well as significantly reduced ROM in the Cspine. Upon palpation, pt was noted to have multiple significant PT in middle/upper traps and  cervical  paraspinals, as well as reduced joint mobility in in Cspine  C3-C5 and [place]pp[place] Tspine T2-T7. Noted to have radicual s/s in the LUE with Spine mobilization in C6-T4. Due to reduced ROM in spine, increased pain in the cspine and into the LUE, as well as noted TP through upper back and Cspine, pt will benefit from skilled PT to reduce over all pain, increase strength, and increased ROM to improve overall QoL.   OBJECTIVE IMPAIRMENTS: decreased ROM, decreased strength, hypomobility, increased fascial restrictions, impaired perceived functional ability, impaired sensation, impaired UE functional use, improper body mechanics, postural dysfunction, and pain.   ACTIVITY LIMITATIONS: carrying, lifting, reach over head, hygiene/grooming, and caring for others  PARTICIPATION LIMITATIONS: meal prep, cleaning, laundry, driving, shopping, community activity, and occupation  PERSONAL FACTORS: Fitness, Past/current experiences, Profession, and Time since onset of injury/illness/exacerbation are also affecting patient's functional outcome.   REHAB POTENTIAL: Excellent  CLINICAL DECISION  MAKING: Stable/uncomplicated  EVALUATION COMPLEXITY: Low   GOALS: Goals reviewed with patient? Yes   SHORT TERM GOALS: Target date: 01/13/2024    Patient will be independent in home exercise program to improve strength/mobility for better functional independence with ADLs. Baseline: provided on 3/25  Goal status: INITIAL   LONG TERM GOALS: Target date: 02/10/2024    Patient will decrease NDI score to equal to or greater than   5  to demonstrate statistically significant improvement in mobility and quality of life.  Baseline: 10/50 Goal status: INITIAL  2.  Patient will improve cervical rotation to at least 55 deg bil without pain to improve safety with driving Baseline: W09 AND L50 deg Goal status: INITIAL  3.  Patient will increase RUE shoulder  strength to symmetrical with LUE  Baseline: grossly 4- to 4/5 Goal  status: INITIAL  4.  Patient will rate radicular pain In the LUE 1/10 at worst to demonstrate improve function and reduced disability with cervical radiculopathy and improved overall QoL.    PLAN:  PT FREQUENCY: 1-2x/week  PT DURATION: 8 weeks  PLANNED INTERVENTIONS: 97110-Therapeutic exercises, 97530- Therapeutic activity, [id number]- Neuromuscular re-education, 97535- Self Care, [id number]- Manual therapy, [id number]- Electrical stimulation (unattended), [phone]- Electrical stimulation (manual), [id number]- Traction (mechanical), Patient/Family education, Taping, Dry Needling, Joint mobilization, Joint manipulation, Spinal manipulation, Spinal mobilization, Cryotherapy, and Moist heat  PLAN FOR NEXT SESSION:  Manual for Cspine and Upper thoracic region.  Provide Nerve glides.  AROM and UE strengthening in pain free range.   Golden Pop, PT 12/15/2023, 2:48 PM

## 2023-12-16 ENCOUNTER — Ambulatory Visit: Admitting: Physical Therapy

## 2023-12-17 DIAGNOSIS — I10 Essential (primary) hypertension: Secondary | ICD-10-CM | POA: Insufficient documentation

## 2023-12-22 ENCOUNTER — Ambulatory Visit

## 2023-12-23 ENCOUNTER — Encounter: Admitting: Physical Therapy

## 2023-12-24 ENCOUNTER — Ambulatory Visit: Attending: Neurosurgery | Admitting: Physical Therapy

## 2023-12-24 ENCOUNTER — Ambulatory Visit: Admitting: Physical Therapy

## 2023-12-24 DIAGNOSIS — M542 Cervicalgia: Secondary | ICD-10-CM

## 2023-12-24 DIAGNOSIS — R29898 Other symptoms and signs involving the musculoskeletal system: Secondary | ICD-10-CM

## 2023-12-24 DIAGNOSIS — M792 Neuralgia and neuritis, unspecified: Secondary | ICD-10-CM

## 2023-12-24 NOTE — Therapy (Signed)
 OUTPATIENT PHYSICAL THERAPY CERVICAL TREATMENT   Patient Name: Madison Morgan MRN: [id number] DOB:07/18/1971, 53 y.o., female Today's Date: 12/24/2023  END OF SESSION:   PT End of Session - 12/24/23 1408     Visit Number 2    Number of Visits 16    Date for PT Re-Evaluation 02/09/24    PT Start Time 1408    PT Stop Time 1450    PT Time Calculation (min) 42 min    Activity Tolerance Patient tolerated treatment well    Behavior During Therapy [facility] for tasks assessed/performed              Past Medical History:  Diagnosis Date   Anxiety    Chronic back pain    Depression    Fibromyalgia    Hyperlipemia    currently taking lipitor   Past Surgical History:  Procedure Laterality Date   BREAST BIOPSY Right 2012   negative, stereotactic biopsy   foot sugery Bilateral    Patient Active Problem List   Diagnosis Date Noted   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   Tobacco use disorder 06/06/2019   Lymphedema 05/05/2019   Leg pain 05/05/2019   GAD (generalized anxiety disorder) 05/02/2019   Panic attacks 05/02/2019   MDD (major depressive disorder), recurrent episode, moderate (HCC) 04/20/2019   Abnormal uterine bleeding (AUB) 12/01/2018   Chronic pain syndrome 07/01/2018   Mild obesity 05/05/2016   COPD (chronic obstructive pulmonary disease) (HCC) 01/31/2014   EBV infection 01/31/2014   Fatigue 01/31/2014   Hyperlipidemia 01/31/2014   Major depression, recurrent, chronic (HCC) 10/08/2012   Depression 09/20/2012    PCP: Marguarite Arbour, MD   REFERRING PROVIDER: Venetia Night, MD   REFERRING DIAG:  Diagnosis  M54.12 (ICD-10-CM) - Cervical radiculopathy  M48.02 (ICD-10-CM) - Cervical stenosis of spinal canal    THERAPY DIAG:  Neck pain  Decreased ROM of neck  Radicular pain in left arm  Rationale for Evaluation and Treatment: Rehabilitation  ONSET DATE: ~ 1 month   SUBJECTIVE:                                                                                                                                                                                                          SUBJECTIVE STATEMENT:  Pt reports she had a couple of really bad days right after initial therapy eval, but otherwise her neck has just been irritating. Pt unsure if the increase in her pain was related to the therapy appointment or just occurred. Reports the pain was primarily down  her L arm and felt like a "rubber band" holding her elbow in a bent position.   Reports she has been doing HEP with some pain during L cervical rotation specifically.   Reports she was on gabapentin, but it makes her too sleepy so she discontinued it. Reports she is on pain medication for other chronic pain.   Reports pain currently 2/10 with radiculopathy into L 1st and 2nd digit.  With questioning, states her computer is on the L side of her at work.  Reports she uses ice at work because it is easier to access and then uses heat at home in the evenings, reports heat feels much better.   Initial Eval: Pt reports that she has a herniated disc that is pinching a nerve on the L side. Reports that she has pins and needle pain as well as tingling and paraesthesia from the elbow running into the first and second digit on the L hand. Report that neck pain with radiculopathy for roughly a month and it got worse about 3 weeks ago following CSI (corticosteroid injection).   Hand dominance: Right  PERTINENT HISTORY:    53 y.o. female presenting for approximately 1 month history of left-sided neck pain with radiation down the left arm.  Most pain is located at the biceps and upper arm.  Reports occasional numbness and tingling of the thumb and index finger.  Over the past 1 week the pain has gotten worse and she started to have increased numbness in her thumb.  Reports dropping something last week when she tried to pick it up with her left hand.  Symptoms improved a little when she  raises her shoulder and worsen when she extends her neck or turns neck toward the affected side.  Patient's medical history significant for chronic back pain and fibromyalgia.  She takes Norco daily and says that is not even helping.  Has not tried any NSAIDs.  Denies any history of neck or shoulder issues.    PAIN:  Are you having pain? Yes: NPRS scale: 1/10 Pain location: L side of the neck.  Pain description: throbbing.  Aggravating factors: turning head to the left   Relieving factors: heat   PRECAUTIONS: Cervical  RED FLAGS: Cervical red flags: none       WEIGHT BEARING RESTRICTIONS: No  FALLS:  Has patient fallen in last 6 months? No  LIVING ENVIRONMENT: Lives with: lives with their spouse Lives in: House/apartment Stairs: Yes: External: 3 from the back  steps; on right going up and on left going up Has following equipment at home:  has can and walker for husband. Does not use  and None  OCCUPATION: courtroom clerk.   PLOF: Independent and Independent with basic ADLs  PATIENT GOALS: decrease pain in the neck and L UE. Improved mobility in neck   NEXT MD VISIT: 5/6. Follow-up with Myer Haff.   OBJECTIVE:  Note: Objective measures were completed at Evaluation unless otherwise noted.  DIAGNOSTIC FINDINGS:  MRI MPRESSION: 1. 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 2. C6-7 disc degeneration with moderate left foraminal stenosis from uncovertebral spur and disc height loss. 3. Levels of facet degeneration described above. ADDENDUM: Request made for quantification of the degree of impingement at C5-6. Impingement is advanced with herniation causing broad compression of the intradural left C6 nerve root. This herniation extends towards the left foraminal entry of C5-6.  PATIENT SURVEYS:  NDI 10/50  COGNITION: Overall cognitive status: Within functional limits for tasks  assessed  SENSATION: Light touch: Impaired  paraesthesia in the L thumb.   POSTURE: forward head and increased thoracic kyphosis  PALPATION: Multiple TP noted in upper thoracic spine and cervical spine. No increased n/t with TP palpation.    CERVICAL ROM:   Active ROM A/PROM (deg) eval  Flexion 20*  Extension 30*  Right lateral flexion 35  Left lateral flexion 40*  Right rotation 44*  Left rotation 50*   (Blank rows = not tested)  * = pain   UPPER EXTREMITYMMT  MMT Right eval Left eval  Shoulder flexion 4 4-*  Shoulder extension 4+ 4  Shoulder abduction 4 4-  Shoulder adduction 4+ 4  Shoulder extension    Shoulder internal rotation 4+ 4  Shoulder external rotation 4 4-  Elbow flexion 4+ 4+  Elbow extension 4+ 4+  Wrist flexion 4+ 4  Wrist extension 4+ 4-  Wrist ulnar deviation    Wrist radial deviation    Wrist pronation    Wrist supination     (Blank rows = not tested)  UPPER EXTREMITY ROM: Grossly WFL  (Blank rows = not tested)  CERVICAL SPECIAL TESTS:  Upper limb tension test (ULTT): Positive, Spurling's test: Positive, and Distraction test: Negative  FUNCTIONAL TESTS:   TREATMENT DATE: 12/24/2023  Manual therapy including:  - soft tissue mobilization to erector spinae in thoracic spine, rhomboids, and other peri-scapular muscles, supraspinatus, mid and upper traps, erector spinae in cervical region as well as suboccipitals - trigger point release in rhomboids and upper traps and pt with possible trigger point near erector spinae in cervical region - cervical traction 3x 30sec hold  - initiated cervical traction with R rotation; however, pt reports feeling strong stretch in muscle on L side of neck with this limiting her tolerance to this movement; attempted readjustment and decrease of rotation with min improvement to tolerance  Educated pt to avoid painful rotation of L cervical rotation during her HEP. Educated pt to consider the ergonomic set-up of  her desk at work - with this questioning realized she is constantly performing L cervical rotation to look at her computer screen - discussed recommendation of rotating her entire chair rather than just her neck to avoid increased pain.  Discussed potential benefits of dry needling and requested front desk to place pt with dry needling certified therapists at next visit.  Provided moist heat at end of session for 5 minutes for pain management.   Pt reports improvement in her symptoms to 1.5/10 and stating her muscles feel less tight/bound up.  PATIENT EDUCATION:  Education details: POC. Benefits of PT Person educated: Patient Education method: Explanation and Handouts Education comprehension: verbalized understanding  HOME EXERCISE PROGRAM: Access Code: [email or web address] URL: [email or web address] Date: 12/16/2023 Prepared by: Grier Rocher  Exercises - Supine Cervical Retraction with Towel  - 1 x daily - 7 x weekly - 3 sets - 10 reps - Supine Cervical Rotation Passive Unloaded  - 1 x daily - 7 x weekly - 2 sets - 3 reps - 10 hold - Shoulder External Rotation and Scapular Retraction  - 1 x daily - 7 x weekly - 3 sets - 10 reps  ASSESSMENT:  CLINICAL IMPRESSION:  Patient is a 53 y.o. female who was seen today for physical therapy treatment for cervical radiculopathy. Pt arrives to therapy session with pain rated as 2/10 with reports of it being primarily radicular pain in her L deltoid region and  1st and 2nd digits of her hand. States she has been consistent with performing HEP, but has some pain with passive L cervical rotation so education provided to avoid this provocative movement via only rotating fewer degrees before pain onset. Therapy session focused on manual therapy techniques to manage soft tissue restrictions due to pt experiencing increased pain after last therapy session. Pt with good tolerance to manual therapy today and reports min improvement in her symptoms. Due to  reduced ROM in spine, increased pain in the cspine and into the LUE, as well as noted TP through upper back and Cspine, pt will benefit from skilled PT to reduce over all pain, increase strength, and increased ROM to improve overall QoL.   OBJECTIVE IMPAIRMENTS: decreased ROM, decreased strength, hypomobility, increased fascial restrictions, impaired perceived functional ability, impaired sensation, impaired UE functional use, improper body mechanics, postural dysfunction, and pain.   ACTIVITY LIMITATIONS: carrying, lifting, reach over head, hygiene/grooming, and caring for others  PARTICIPATION LIMITATIONS: meal prep, cleaning, laundry, driving, shopping, community activity, and occupation  PERSONAL FACTORS: Fitness, Past/current experiences, Profession, and Time since onset of injury/illness/exacerbation are also affecting patient's functional outcome.   REHAB POTENTIAL: Excellent  CLINICAL DECISION MAKING: Stable/uncomplicated  EVALUATION COMPLEXITY: Low   GOALS: Goals reviewed with patient? Yes   SHORT TERM GOALS: Target date: 01/13/2024    Patient will be independent in home exercise program to improve strength/mobility for better functional independence with ADLs. Baseline: provided on 3/25  Goal status: INITIAL   LONG TERM GOALS: Target date: 02/10/2024    Patient will decrease NDI score to equal to or greater than   5  to demonstrate statistically significant improvement in mobility and quality of life.  Baseline: 10/50 Goal status: INITIAL  2.  Patient will improve cervical rotation to at least 55 deg bil without pain to improve safety with driving Baseline: Z61 AND L50 deg Goal status: INITIAL  3.  Patient will increase RUE shoulder  strength to symmetrical with LUE  Baseline: grossly 4- to 4/5 Goal status: INITIAL  4.  Patient will rate radicular pain In the LUE 1/10 at worst to demonstrate improve function and reduced disability with cervical radiculopathy and  improved overall QoL.    PLAN:  PT FREQUENCY: 1-2x/week  PT DURATION: 8 weeks  PLANNED INTERVENTIONS: 97110-Therapeutic exercises, 97530- Therapeutic activity, [id number]- Neuromuscular re-education, 97535- Self Care, 09604- Manual therapy, G0283- Electrical stimulation (unattended), [phone]- Electrical stimulation (manual), [id number]- Traction (mechanical), Patient/Family education, Taping, Dry Needling, Joint mobilization, Joint manipulation, Spinal manipulation, Spinal mobilization, Cryotherapy, and Moist heat  PLAN FOR NEXT SESSION:  Manual for Cspine and Upper thoracic region.  Provide Nerve glides.  AROM and UE strengthening in pain free range Discuss dry needling benefits  Tovah Slavick, PT, DPT, NCS, CSRS Physical Therapist - [place]  [facility]  3:11 PM 12/24/23

## 2023-12-25 ENCOUNTER — Encounter

## 2023-12-28 ENCOUNTER — Ambulatory Visit: Admitting: Physical Therapy

## 2023-12-29 ENCOUNTER — Ambulatory Visit: Admitting: Physical Therapy

## 2023-12-30 ENCOUNTER — Ambulatory Visit: Admitting: Physical Therapy

## 2023-12-30 ENCOUNTER — Encounter: Admitting: Physical Therapy

## 2023-12-30 DIAGNOSIS — M792 Neuralgia and neuritis, unspecified: Secondary | ICD-10-CM

## 2023-12-30 DIAGNOSIS — R29898 Other symptoms and signs involving the musculoskeletal system: Secondary | ICD-10-CM

## 2023-12-30 DIAGNOSIS — M542 Cervicalgia: Secondary | ICD-10-CM

## 2023-12-30 NOTE — Therapy (Signed)
 OUTPATIENT PHYSICAL THERAPY CERVICAL TREATMENT   Patient Name: Madison Morgan MRN: [id number] DOB:1971-06-08, 53 y.o., female Today's Date: 12/30/2023  END OF SESSION:      Past Medical History:  Diagnosis Date   Anxiety    Chronic back pain    Depression    Fibromyalgia    Hyperlipemia    currently taking lipitor   Past Surgical History:  Procedure Laterality Date   BREAST BIOPSY Right 2012   negative, stereotactic biopsy   foot sugery Bilateral    Patient Active Problem List   Diagnosis Date Noted   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   Tobacco use disorder 06/06/2019   Lymphedema 05/05/2019   Leg pain 05/05/2019   GAD (generalized anxiety disorder) 05/02/2019   Panic attacks 05/02/2019   MDD (major depressive disorder), recurrent episode, moderate (HCC) 04/20/2019   Abnormal uterine bleeding (AUB) 12/01/2018   Chronic pain syndrome 07/01/2018   Mild obesity 05/05/2016   COPD (chronic obstructive pulmonary disease) (HCC) 01/31/2014   EBV infection 01/31/2014   Fatigue 01/31/2014   Hyperlipidemia 01/31/2014   Major depression, recurrent, chronic (HCC) 10/08/2012   Depression 09/20/2012    PCP: Marguarite Arbour, MD   REFERRING PROVIDER: Venetia Night, MD   REFERRING DIAG:  Diagnosis  M54.12 (ICD-10-CM) - Cervical radiculopathy  M48.02 (ICD-10-CM) - Cervical stenosis of spinal canal    THERAPY DIAG:  Neck pain  Decreased ROM of neck  Radicular pain in left arm  Rationale for Evaluation and Treatment: Rehabilitation  ONSET DATE: ~ 1 month   SUBJECTIVE:                                                                                                                                                                                                         SUBJECTIVE STATEMENT:  Pt reports that she has no pain in the neck. Mild tightness in the UT, and reduced tingling since injection and starting PT.   Reports she has been doing HEP  with some pain during L cervical rotation specifically.   Pt reports that she has repositioned computer monitor on desk, and has limited rotation to the L.    Reports she uses ice at work because it is easier to access and then uses heat at home in the evenings, reports heat feels much better.   Initial Eval: Pt reports that she has a herniated disc that is pinching a nerve on the L side. Reports that she has pins and needle pain as well as tingling and paraesthesia from the elbow running into  the first and second digit on the L hand. Report that neck pain with radiculopathy for roughly a month and it got worse about 3 weeks ago following CSI (corticosteroid injection).   Hand dominance: Right  PERTINENT HISTORY:    53 y.o. female presenting for approximately 1 month history of left-sided neck pain with radiation down the left arm.  Most pain is located at the biceps and upper arm.  Reports occasional numbness and tingling of the thumb and index finger.  Over the past 1 week the pain has gotten worse and she started to have increased numbness in her thumb.  Reports dropping something last week when she tried to pick it up with her left hand.  Symptoms improved a little when she raises her shoulder and worsen when she extends her neck or turns neck toward the affected side.  Patient's medical history significant for chronic back pain and fibromyalgia.  She takes Norco daily and says that is not even helping.  Has not tried any NSAIDs.  Denies any history of neck or shoulder issues.    PAIN:  Are you having pain? Yes: NPRS scale: 1/10 Pain location: L side of the neck.  Pain description: throbbing.  Aggravating factors: turning head to the left   Relieving factors: heat   PRECAUTIONS: Cervical  RED FLAGS: Cervical red flags: none       WEIGHT BEARING RESTRICTIONS: No  FALLS:  Has patient fallen in last 6 months? No  LIVING ENVIRONMENT: Lives with: lives with their spouse Lives in:  House/apartment Stairs: Yes: External: 3 from the back  steps; on right going up and on left going up Has following equipment at home:  has can and walker for husband. Does not use  and None  OCCUPATION: courtroom clerk.   PLOF: Independent and Independent with basic ADLs  PATIENT GOALS: decrease pain in the neck and L UE. Improved mobility in neck   NEXT MD VISIT: 5/6. Follow-up with Myer Haff.   OBJECTIVE:  Note: Objective measures were completed at Evaluation unless otherwise noted.  DIAGNOSTIC FINDINGS:  MRI MPRESSION: 1. 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 2. C6-7 disc degeneration with moderate left foraminal stenosis from uncovertebral spur and disc height loss. 3. Levels of facet degeneration described above. ADDENDUM: Request made for quantification of the degree of impingement at C5-6. Impingement is advanced with herniation causing broad compression of the intradural left C6 nerve root. This herniation extends towards the left foraminal entry of C5-6.  PATIENT SURVEYS:  NDI 10/50  COGNITION: Overall cognitive status: Within functional limits for tasks assessed  SENSATION: Light touch: Impaired  paraesthesia in the L thumb.   POSTURE: forward head and increased thoracic kyphosis  PALPATION: Multiple TP noted in upper thoracic spine and cervical spine. No increased n/t with TP palpation.    CERVICAL ROM:   Active ROM A/PROM (deg) eval  Flexion 20*  Extension 30*  Right lateral flexion 35  Left lateral flexion 40*  Right rotation 44*  Left rotation 50*   (Blank rows = not tested)  * = pain   UPPER EXTREMITYMMT  MMT Right eval Left eval  Shoulder flexion 4 4-*  Shoulder extension 4+ 4  Shoulder abduction 4 4-  Shoulder adduction 4+ 4  Shoulder extension    Shoulder internal rotation 4+ 4  Shoulder external rotation 4 4-  Elbow flexion 4+ 4+  Elbow extension 4+  4+  Wrist  flexion 4+ 4  Wrist extension 4+ 4-  Wrist ulnar deviation    Wrist radial deviation    Wrist pronation    Wrist supination     (Blank rows = not tested)  UPPER EXTREMITY ROM: Grossly WFL  (Blank rows = not tested)  CERVICAL SPECIAL TESTS:  Upper limb tension test (ULTT): Positive, Spurling's test: Positive, and Distraction test: Negative  FUNCTIONAL TESTS:   TREATMENT DATE: 12/30/2023  UBE 1.5 min forward/1.5 min reverse Seated chin tucks x 10 with 3 sec hold.  UT stretch 2 x 20 sec bil ER with scapular retraction x 12 RTB  Single arm low row x 10, cues for improved lower trap an dlat activation and decreased trap activation.  Prone ER x 10 bil off side of bed  Prone single limb shoulder flexion with 3 sec hold x 8 bil   Education of UT/levator scapulae TP correlation in cervical pain/radicular s/s.   STM to L UT and levator x 6 min with TP release and cervical paraspinals with TP release x 4 min   PATIENT EDUCATION:  Education details: POC. Benefits of PT Person educated: Patient Education method: Explanation and Handouts Education comprehension: verbalized understanding  HOME EXERCISE PROGRAM: Access Code: [email or web address] URL: [email or web address] Date: 12/16/2023 Prepared by: Grier Rocher  Exercises - Supine Cervical Retraction with Towel  - 1 x daily - 7 x weekly - 3 sets - 10 reps - Supine Cervical Rotation Passive Unloaded  - 1 x daily - 7 x weekly - 2 sets - 3 reps - 10 hold - Shoulder External Rotation and Scapular Retraction  - 1 x daily - 7 x weekly - 3 sets - 10 reps  ASSESSMENT:  CLINICAL IMPRESSION:  Patient is a 53 y.o. female who was seen today for physical therapy treatment for cervical radiculopathy. Pt arrives to therapy session with pain rated as 1/10 with reports of it being primarily in the L shoulder. Therex to improved postural and reduce UT over activation. STM to L side traps and cspine. Improved ROM and pain reported upon  completion.  Due to reduced ROM in spine, increased pain in the cspine and into the LUE, as well as noted TP through upper back and Cspine, pt will benefit from skilled PT to reduce over all pain, increase strength, and increased ROM to improve overall QoL.   OBJECTIVE IMPAIRMENTS: decreased ROM, decreased strength, hypomobility, increased fascial restrictions, impaired perceived functional ability, impaired sensation, impaired UE functional use, improper body mechanics, postural dysfunction, and pain.   ACTIVITY LIMITATIONS: carrying, lifting, reach over head, hygiene/grooming, and caring for others  PARTICIPATION LIMITATIONS: meal prep, cleaning, laundry, driving, shopping, community activity, and occupation  PERSONAL FACTORS: Fitness, Past/current experiences, Profession, and Time since onset of injury/illness/exacerbation are also affecting patient's functional outcome.   REHAB POTENTIAL: Excellent  CLINICAL DECISION MAKING: Stable/uncomplicated  EVALUATION COMPLEXITY: Low   GOALS: Goals reviewed with patient? Yes   SHORT TERM GOALS: Target date: 01/13/2024    Patient will be independent in home exercise program to improve strength/mobility for better functional independence with ADLs. Baseline: provided on 3/25  Goal status: INITIAL   LONG TERM GOALS: Target date: 02/10/2024    Patient will decrease NDI score to equal to or greater than   5  to demonstrate statistically significant improvement in mobility and quality of life.  Baseline: 10/50 Goal status: INITIAL  2.  Patient will improve cervical rotation to at least 55 deg bil without pain to improve safety with driving  Baseline: R45 AND L50 deg Goal status: INITIAL  3.  Patient will increase RUE shoulder  strength to symmetrical with LUE  Baseline: grossly 4- to 4/5 Goal status: INITIAL  4.  Patient will rate radicular pain In the LUE 1/10 at worst to demonstrate improve function and reduced disability with cervical  radiculopathy and improved overall QoL.    PLAN:  PT FREQUENCY: 1-2x/week  PT DURATION: 8 weeks  PLANNED INTERVENTIONS: 97110-Therapeutic exercises, 97530- Therapeutic activity, [id number]- Neuromuscular re-education, 97535- Self Care, 91478- Manual therapy, G0283- Electrical stimulation (unattended), [phone]- Electrical stimulation (manual), [id number]- Traction (mechanical), Patient/Family education, Taping, Dry Needling, Joint mobilization, Joint manipulation, Spinal manipulation, Spinal mobilization, Cryotherapy, and Moist heat  PLAN FOR NEXT SESSION:  Manual for Cspine and Upper thoracic region.  Provide Nerve glides.  AROM and UE strengthening in pain free range  Guided meditaion   Grier Rocher PT, DPT  Physical Therapist - [place]  [facility]  5:30 PM 12/30/23

## 2024-01-01 ENCOUNTER — Encounter: Admitting: Physical Therapy

## 2024-01-05 ENCOUNTER — Encounter: Admitting: Physical Therapy

## 2024-01-06 ENCOUNTER — Ambulatory Visit: Admitting: Physical Therapy

## 2024-01-07 ENCOUNTER — Encounter: Admitting: Physical Therapy

## 2024-01-11 ENCOUNTER — Ambulatory Visit: Admitting: Physical Therapy

## 2024-01-13 ENCOUNTER — Ambulatory Visit: Admitting: Physical Therapy

## 2024-01-14 ENCOUNTER — Encounter: Admitting: Physical Therapy

## 2024-01-14 ENCOUNTER — Ambulatory Visit: Admitting: Physical Therapy

## 2024-01-19 ENCOUNTER — Encounter: Admitting: Physical Therapy

## 2024-01-22 ENCOUNTER — Encounter: Admitting: Physical Therapy

## 2024-01-26 ENCOUNTER — Ambulatory Visit: Admitting: Neurosurgery

## 2024-01-27 ENCOUNTER — Encounter

## 2024-02-03 ENCOUNTER — Encounter

## 2024-02-04 ENCOUNTER — Ambulatory Visit: Admitting: Physical Therapy

## 2024-02-04 ENCOUNTER — Ambulatory Visit: Admitting: Neurosurgery

## 2024-02-05 ENCOUNTER — Encounter: Admitting: Physical Therapy

## 2024-02-08 ENCOUNTER — Encounter: Admitting: Physical Therapy

## 2024-02-09 ENCOUNTER — Ambulatory Visit: Admitting: Physical Therapy

## 2024-02-11 ENCOUNTER — Ambulatory Visit: Admitting: Physical Therapy

## 2024-02-16 ENCOUNTER — Ambulatory Visit: Admitting: Physical Therapy

## 2024-02-18 ENCOUNTER — Encounter

## 2024-02-18 ENCOUNTER — Encounter: Payer: Self-pay | Admitting: Neurosurgery

## 2024-02-18 ENCOUNTER — Ambulatory Visit: Admitting: Physical Therapy

## 2024-02-18 ENCOUNTER — Ambulatory Visit: Admitting: Neurosurgery

## 2024-02-18 VITALS — BP 128/76 | Ht 65.0 in | Wt 159.0 lb

## 2024-02-18 DIAGNOSIS — M5412 Radiculopathy, cervical region: Secondary | ICD-10-CM | POA: Diagnosis not present

## 2024-02-18 DIAGNOSIS — G5132 Clonic hemifacial spasm, left: Secondary | ICD-10-CM

## 2024-02-18 NOTE — Progress Notes (Signed)
 Referring Physician:  Yehuda Helms, MD [address] [facility] [place],  [place] [address]  Primary Physician:  Yehuda Helms, MD  History of Present Illness: 02/18/2024 She is doing very well.  Her pain is much improved.  She still has some small amount of discomfort.  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  12/01/2023 Madison Morgan is here today with a chief complaint of left arm pain, weakness, and tingling. The symptoms started about a month ago with tingling, and two weeks ago, the patient began experiencing pain. The pain extends from the neck down to the thumb and index finger of the left hand. The patient reports that the pain is bearable at times and unbearable at others, but does not feel it is getting worse. The patient has tried hydrocodone  and prednisone  for symptom management, but reports no significant improvement. The patient denies any precipitating events or similar past experiences. The patient also reports discomfort in the left shoulder blade area and a burning sensation in the left arm.     Bowel/Bladder Dysfunction: none  Conservative measures:  Physical therapy:  has not participated in PT Multimodal medical therapy including regular antiinflammatories:  Hydrocodone , Prednisone ,  Injections: no epidural steroid injections  Past Surgery: none  Madison Morgan has no symptoms of cervical myelopathy.  The symptoms are causing a significant impact on the patient's life.   I have utilized the care everywhere function in epic to review the outside records available from external health systems.  Review of Systems:  A 10 point review of systems is negative, except for the pertinent positives and negatives detailed in the HPI.  Past Medical History: Past Medical History:  Diagnosis Date   Anxiety    Chronic back pain    Depression    Fibromyalgia    Hyperlipemia    currently taking lipitor    Past Surgical  History: Past Surgical History:  Procedure Laterality Date   BREAST BIOPSY Right 2012   negative, stereotactic biopsy   foot sugery Bilateral     Allergies: Allergies as of 02/18/2024   (No Known Allergies)    Medications:  Current Outpatient Medications:    atorvastatin (LIPITOR) 20 MG tablet, Take by mouth., Disp: , Rfl:    BOTOX 100 units SOLR injection, Inject into the muscle., Disp: , Rfl:    buPROPion  (WELLBUTRIN  SR) 150 MG 12 hr tablet, Take by mouth., Disp: , Rfl:    Cholecalciferol 100 MCG (4000 UT) TABS, Take by mouth., Disp: , Rfl:    escitalopram (LEXAPRO) 20 MG tablet, Take 20 mg by mouth daily., Disp: , Rfl:    gabapentin (NEURONTIN) 100 MG capsule, 1 po qHS, Disp: , Rfl:    HYDROcodone -acetaminophen  (NORCO) 10-325 MG tablet, Take by mouth., Disp: , Rfl:    predniSONE  (DELTASONE ) 10 MG tablet, Take 5 tabs p.o. x 2 days, 4 tabs x 2 days, 3 tabs x 2 days, 2 tabs x 2 days, 1 tab x 2 days, Disp: 30 tablet, Rfl: 0   RABEprazole (ACIPHEX) 20 MG tablet, Take by mouth., Disp: , Rfl:    triamterene-hydrochlorothiazide (MAXZIDE) 75-50 MG tablet, Take by mouth., Disp: , Rfl:   Social History: Social History   Tobacco Use   Smoking status: Former    Current packs/day: 0.00    Types: Cigarettes    Quit date: 05/2021    Years since quitting: 2.7   Smokeless tobacco: Never  Vaping Use   Vaping status:  Never Used  Substance Use Topics   Alcohol use: No   Drug use: No    Family Medical History: Family History  Problem Relation Age of Onset   Depression Father    Breast cancer Neg Hx     Physical Examination: There were no vitals filed for this visit.   General: Patient is in no apparent distress. Attention to examination is appropriate.  Neck:   Supple.  Full range of motion with discomfort on extension and rotation to left .  Respiratory: Patient is breathing without any difficulty.   NEUROLOGICAL:     Awake, alert, oriented to person, place, and time.   Speech is clear and fluent.   Cranial Nerves: Pupils equal round and reactive to light.  Facial tone is symmetric.  Facial sensation is symmetric. Shoulder shrug is symmetric. Tongue protrusion is midline.  There is no pronator drift.  Strength: Side Biceps Triceps Deltoid Interossei Grip Wrist Ext. Wrist Flex.  R 5 5 5 5 5 5 5   L 5 5 5 5 5 5 5    Side Iliopsoas Quads Hamstring PF DF EHL  R 5 5 5 5 5 5   L 5 5 5 5 5 5    Reflexes are 1+ and symmetric at the biceps, triceps, brachioradialis, patella and achilles.   Hoffman's is absent.   Bilateral upper and lower extremity sensation is intact to light touch.    No evidence of dysmetria noted.  Gait is normal.     Medical Decision Making  Imaging: MRI C spine 11/30/2023 IMPRESSION: 1. 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 2. C6-7 disc degeneration with moderate left foraminal stenosis from uncovertebral spur and disc height loss. 3. Levels of facet degeneration described above.   Electronically Signed: By: Ronnette Coke M.D. On: 12/01/2023 08:11 ADDENDUM: Request made for quantification of the degree of impingement at C5-6. Impingement is advanced with herniation causing broad compression of the intradural left C6 nerve root. This herniation extends towards the left foraminal entry of C5-6.     Electronically Signed   By: Ronnette Coke M.D.   On: 12/01/2023 11:11  I have personally reviewed the images and agree with the above interpretation.  Assessment and Plan: Madison Morgan is a pleasant 53 y.o. female with cervical radiculopathy most consistent with left C6 distribution.  Her symptoms have improved.  Will follow her up on an as-needed basis for her radiculopathy.  She expressed that her facial spasms are bothering her more.  I would like to have her evaluated for microvascular decompression for hemifacial spasm.  She has tried and  failed Botox injections.  I will refer her to my colleague Dr. Janjua to be evaluated in clinic after he starts in July.    I spent a total of 10 minutes in this patient's care today. This time was spent reviewing pertinent records including imaging studies, obtaining and confirming history, performing a directed evaluation, formulating and discussing my recommendations, and documenting the visit within the medical record.    Thank you for involving me in the care of this patient.      Omie Ferger K. Mont Antis MD, [facility]

## 2024-02-22 ENCOUNTER — Encounter: Admitting: Physical Therapy

## 2024-02-23 ENCOUNTER — Ambulatory Visit: Admitting: Physical Therapy

## 2024-02-24 ENCOUNTER — Encounter: Admitting: Physical Therapy

## 2024-02-25 ENCOUNTER — Ambulatory Visit: Admitting: Physical Therapy

## 2024-02-29 ENCOUNTER — Encounter: Admitting: Physical Therapy

## 2024-03-01 ENCOUNTER — Ambulatory Visit: Admitting: Physical Therapy

## 2024-03-02 ENCOUNTER — Encounter: Admitting: Physical Therapy

## 2024-03-03 ENCOUNTER — Ambulatory Visit

## 2024-03-07 ENCOUNTER — Encounter: Admitting: Physical Therapy

## 2024-03-08 ENCOUNTER — Ambulatory Visit: Admitting: Physical Therapy

## 2024-03-09 ENCOUNTER — Encounter: Admitting: Physical Therapy

## 2024-03-10 ENCOUNTER — Ambulatory Visit: Admitting: Physical Therapy

## 2024-03-14 ENCOUNTER — Encounter: Admitting: Physical Therapy

## 2024-03-15 ENCOUNTER — Ambulatory Visit: Admitting: Physical Therapy

## 2024-03-16 ENCOUNTER — Encounter: Admitting: Physical Therapy

## 2024-03-17 ENCOUNTER — Ambulatory Visit: Admitting: Physical Therapy

## 2024-03-22 ENCOUNTER — Ambulatory Visit: Admitting: Physical Therapy

## 2024-03-24 ENCOUNTER — Ambulatory Visit: Admitting: Physical Therapy

## 2024-03-29 ENCOUNTER — Ambulatory Visit: Admitting: Physical Therapy

## 2024-03-31 ENCOUNTER — Ambulatory Visit: Admitting: Physical Therapy

## 2024-03-31 ENCOUNTER — Ambulatory Visit: Admitting: Neurosurgery

## 2024-04-05 ENCOUNTER — Ambulatory Visit: Admitting: Neurosurgery

## 2024-04-05 ENCOUNTER — Ambulatory Visit: Admitting: Physical Therapy

## 2024-04-07 ENCOUNTER — Ambulatory Visit: Admitting: Physical Therapy

## 2024-04-19 ENCOUNTER — Ambulatory Visit: Admitting: Neurosurgery

## 2024-05-03 ENCOUNTER — Ambulatory Visit: Admitting: Neurosurgery

## 2024-05-04 ENCOUNTER — Ambulatory Visit: Admitting: Neurosurgery

## 2024-05-24 ENCOUNTER — Ambulatory Visit: Admitting: Neurosurgery

## 2024-05-31 ENCOUNTER — Encounter: Payer: Self-pay | Admitting: Neurosurgery

## 2024-05-31 ENCOUNTER — Ambulatory Visit: Admitting: Neurosurgery

## 2024-05-31 VITALS — BP 109/72 | HR 72 | Ht 65.0 in | Wt 147.0 lb

## 2024-05-31 DIAGNOSIS — G5132 Clonic hemifacial spasm, left: Secondary | ICD-10-CM | POA: Diagnosis not present

## 2024-05-31 NOTE — Progress Notes (Unsigned)
 Assessment : 53 year old lady with a past medical history for cervical spine problems who as a child frequently had twitches in her face but they chalked it off to nervousness.  About 15 years ago, she started having left-sided facial spasms and over time these got worse.  She was seen by neurologist and gabapentin was started but it gave her significant cognitive problems.  Eventually she ended up going to [facility] for Botox injections and had 3 of these without significant relief.  She was seen by my partner Dr. Clois for her cervical spine problems and he referred her to us .  She says that there is not a single day that goes by that she does not have spasms predominantly in the middle and lower section of her left face.  She works as a Radiation protection practitioner and this is very apparent to everybody and debilitating.  She does not have any facial sensation loss from this.  She was accompanied by her husband.  Plan : She had significant spasms during her visit and her neurological examination otherwise was normal.  Unfortunately she has not had any imaging and I will get an MRI of the brain and I have specifically requested sequences to evaluate vascular compression.  The other thing about her story is that she was having this as a child already.  One of the possibilities here is that she has a congenital tumor that has been causing this ever since then and over time her symptoms have gotten worse.  Occasionally you can see this with an epidermoid tumor which can cause irritation and subsequently hemifacial spasms.  I will get the imaging done and I will see her back thereafter.  I have offered her a referral to neurology to try different medications but she does not want to do that.   Social History   Socioeconomic History   Marital status: Married    Spouse name: christopher   Number of children: 2   Years of education: Not on file   Highest education level: High school graduate  Occupational  History    Comment: full time  Tobacco Use   Smoking status: Former    Current packs/day: 0.00    Types: Cigarettes    Quit date: 05/2021    Years since quitting: 3.0   Smokeless tobacco: Never  Vaping Use   Vaping status: Never Used  Substance and Sexual Activity   Alcohol use: No   Drug use: No   Sexual activity: Yes  Other Topics Concern   Not on file  Social History Narrative   Not on file   Social Drivers of Health   Financial Resource Strain: Low Risk  (11/30/2023)   Received from [facility]   Overall Financial Resource Strain (CARDIA)    Difficulty of Paying Living Expenses: Not very hard  Food Insecurity: No Food Insecurity (11/30/2023)   Received from [facility]   Hunger Vital Sign    Within the past 12 months, you worried that your food would run out before you got the money to buy more.: Never true    Within the past 12 months, the food you bought just didn't last and you didn't have money to get more.: Never true  Transportation Needs: No Transportation Needs (11/30/2023)   Received from [facility] - Transportation    In the past 12 months, has lack of transportation kept you from medical appointments or from getting medications?: No  Lack of Transportation (Non-Medical): No  Physical Activity: Inactive (12/01/2018)   Exercise Vital Sign    Days of Exercise per Week: 0 days    Minutes of Exercise per Session: 0 min  Stress: Stress Concern Present (12/01/2018)   [employer] [facility] [employer] - Occupational Stress Questionnaire    Feeling of Stress : Very much  Social Connections: Unknown (12/01/2018)   Social Connection and Isolation Panel    Frequency of Communication with Friends and Family: Not on file    Frequency of Social Gatherings with Friends and Family: Not on file    Attends Religious Services: Never    Active Member of Clubs or Organizations: No    Attends Tax inspector Meetings: Never    Marital Status: Married  Catering manager Violence: Not At Risk (12/01/2018)   Humiliation, Afraid, Rape, and Kick questionnaire    Fear of Current or Ex-Partner: No    Emotionally Abused: No    Physically Abused: No    Sexually Abused: No    Family History  Problem Relation Age of Onset   Depression Father    Breast cancer Neg Hx     No Known Allergies  Past Medical History:  Diagnosis Date   Anxiety    Chronic back pain    Depression    Fibromyalgia    Hyperlipemia    currently taking lipitor    Past Surgical History:  Procedure Laterality Date   BREAST BIOPSY Right 2012   negative, stereotactic biopsy   foot sugery Bilateral      Physical Exam HENT:     Head: Normocephalic.     Nose: Nose normal.  Eyes:     Pupils: Pupils are equal, round, and reactive to light.  Cardiovascular:     Rate and Rhythm: Normal rate.  Pulmonary:     Effort: Pulmonary effort is normal.  Abdominal:     General: Abdomen is flat.  Musculoskeletal:     Cervical back: Normal range of motion.  Neurological:     Mental Status: She is alert.     Cranial Nerves: Facial asymmetry present.     Sensory: Sensation is intact.     Motor: Motor function is intact.     Coordination: Coordination is intact.        Results for orders placed or performed during the hospital encounter of 07/07/18  CT HEAD WO CONTRAST   Narrative   CLINICAL DATA:  Clemens down concrete stairs several weeks ago. Unsure loss of consciousness. Posterior head is numb and stains.  EXAM: CT HEAD WITHOUT CONTRAST  TECHNIQUE: Contiguous axial images were obtained from the base of the skull through the vertex without intravenous contrast.  COMPARISON:  09/03/2010 CT  FINDINGS: Brain: No evidence for acute infarction, hemorrhage, mass lesion, hydrocephalus, or extra-axial fluid. Normal for age cerebral volume. No white matter disease.  Vascular: No hyperdense vessel or  unexpected calcification.  Skull: Normal. Negative for fracture or focal lesion.  Sinuses/Orbits: No acute finding.  Other: None.  IMPRESSION: Negative exam.  No change from priors.   Electronically Signed   By: Norleen ONEIDA Catching M.D.   On: 07/08/2018 07:36

## 2024-06-01 ENCOUNTER — Encounter: Payer: Self-pay | Admitting: Neurosurgery

## 2024-06-02 ENCOUNTER — Inpatient Hospital Stay: Admission: RE | Admit: 2024-06-02 | Source: Ambulatory Visit

## 2024-06-04 ENCOUNTER — Inpatient Hospital Stay: Admission: RE | Admit: 2024-06-04 | Source: Ambulatory Visit

## 2024-06-10 ENCOUNTER — Other Ambulatory Visit

## 2024-06-22 ENCOUNTER — Other Ambulatory Visit: Payer: Self-pay | Admitting: Internal Medicine

## 2024-06-22 DIAGNOSIS — R7989 Other specified abnormal findings of blood chemistry: Secondary | ICD-10-CM

## 2024-06-23 ENCOUNTER — Other Ambulatory Visit: Payer: Self-pay | Admitting: Internal Medicine

## 2024-06-23 DIAGNOSIS — Z1231 Encounter for screening mammogram for malignant neoplasm of breast: Secondary | ICD-10-CM

## 2024-06-24 ENCOUNTER — Ambulatory Visit
Admission: RE | Admit: 2024-06-24 | Discharge: 2024-06-24 | Disposition: A | Discharge status: Home / Self Care | Source: Ambulatory Visit | Attending: Neurosurgery | Admitting: Neurosurgery

## 2024-06-24 DIAGNOSIS — G5132 Clonic hemifacial spasm, left: Secondary | ICD-10-CM

## 2024-06-28 ENCOUNTER — Ambulatory Visit: Admitting: Neurosurgery

## 2024-06-28 ENCOUNTER — Encounter: Payer: Self-pay | Admitting: Neurosurgery

## 2024-06-28 VITALS — BP 121/75 | HR 63 | Temp 97.9°F | Ht 65.0 in | Wt 156.4 lb

## 2024-06-28 DIAGNOSIS — G5132 Clonic hemifacial spasm, left: Secondary | ICD-10-CM

## 2024-06-28 NOTE — Progress Notes (Signed)
 53 year old lady with left-sided hemifacial spasm for a long period of time.  She was evaluated by Dr. Lane in [place] who started her on gabapentin but due to the side effects she could not take this.  Therefore, she was referred to [facility] for Botox injections.  She is back and we reviewed the MRI which demonstrates a loop of the eye, along this 7/8 complex on the left.  I went over the images with her and her husband and I explained to them that a microvascular decompression entails putting a pledget between this vessel and the nerves.  However, oftentimes the labyrinth and artery can originate from this eye, and this makes mobilization of this vessel next to impossible.  I told him that in case the vessel gets injured, it can cause significant problems with deafness and vertigo.  Even then, putting a pledget in this area can result in the symptoms.  We had a lengthy conversation about this and I told her that if she was my wife, I would definitely recommend an evaluation by neurology and she wants to go to a different neurologist.  To that end, I would make a referral to a different neurologist and I would like for her to be tried on different medications prior to committing to surgery.  During her visit today her hemifacial spasm was very mild and the indication for surgery here is not that strong in my opinion.  Nevertheless, I think if she fails medical management and wants to have this done we can entertain this.

## 2024-06-30 ENCOUNTER — Encounter: Payer: Self-pay | Admitting: Neurology

## 2024-06-30 ENCOUNTER — Ambulatory Visit
Admission: RE | Admit: 2024-06-30 | Discharge: 2024-06-30 | Disposition: A | Discharge status: Home / Self Care | Source: Ambulatory Visit | Attending: Internal Medicine | Admitting: Internal Medicine

## 2024-06-30 DIAGNOSIS — R7989 Other specified abnormal findings of blood chemistry: Secondary | ICD-10-CM | POA: Diagnosis present

## 2024-07-19 ENCOUNTER — Other Ambulatory Visit: Payer: Self-pay | Admitting: Medical Genetics

## 2024-07-19 DIAGNOSIS — Z006 Encounter for examination for normal comparison and control in clinical research program: Secondary | ICD-10-CM

## 2024-07-27 ENCOUNTER — Ambulatory Visit
Admission: RE | Admit: 2024-07-27 | Discharge: 2024-07-27 | Disposition: A | Discharge status: Home / Self Care | Source: Ambulatory Visit | Attending: Internal Medicine | Admitting: Internal Medicine

## 2024-07-27 DIAGNOSIS — Z1231 Encounter for screening mammogram for malignant neoplasm of breast: Secondary | ICD-10-CM | POA: Diagnosis present

## 2024-08-04 NOTE — Progress Notes (Signed)
 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 -no evidence of Meige syndrome  - Patient has tried and failed gabapentin, Lyrica, clonazepam, baclofen.  In my opinion, there are rarely other medications that help hemifacial spasm.  - She has done a number of rounds of Botox at [facility].  I am happy to trial again.  Discussed with her that I think that Botox really is the treatment of choice for this but since the R eye not done as part of blepharospasm, maybe that will be the key.  She agreed and would like to trial it.  We discussed extensively the risk, benefits, side effects, including the black box warning and what that meant.  We discussed risks for ecchymosis, incomplete closure of the eye, facial asymmetry, drooping of the face and/or eye, diplopia.  2.  GAD  -this is adding to the sx's and discussed CBT, biofeedback, etc and told her that anything that she can do to lower stress will help sx's. Subjective:   Madison Morgan was seen today in neurologic consultation at the request of Rosslyn Dino HERO, MD.  The consultation is for the evaluation of hemifacial spasm. Pt with husband who supplements hx.   I am the third neurologist.  Patient previously saw Dr. Darlyn Farrow as well as Dr. Chauncey Ruth.  Notes are reviewed.  Patient first presented to Dr. Farrow June, 2022.  He diagnosed the patient with a tic disorder, noting that the patient had had tics that affected the left side of her face since childhood, but could go months or years without having any symptoms.  She presented to him with involuntary closure of the left eye and twitching of the left face.  He felt these were consistent with her tics and started her on gabapentin.  She followed up a few months later and noted that symptoms were worse and the patient was started on Lyrica.  She then followed up in March, 2023 and baclofen was increased and [facility]  She initially saw  Dr. Ruth at [facility] in August, 2023.  He diagnosed hemifacial spasm.  He recommended Botox.  Her injections were completed on July 21, 2022.  She followed up with him 3 months later and she told him that she did not think it was particularly effective.  He did skip the injections to the zygomaticus and levator anguli oris because of facial droop.  She followed up 6 or 7 months later and he again adjusted the injections, this time adding some back to levator anguli oris and adding more to orbicularis oculi.  Her last injections were December, 2024.  Pt doesn't recall any droop or SE with the botox.  She was subsequently referred to Dr. Janjua and she saw him June 28, 2024.  She had an MRI brain June 24, 2024 and there was felt nothing there to explain the left hemifacial spasm, with normal contrast-enhancement  Pt reports that she has had sx's for years and estimates that this to be about 10 years.  She reports it started with winking on the L but also looking like the face would scrunch.  Its gotten more frequent.  It used to happen with stress but now its other times, even when relaxed.  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  No neck movement.  No voice changes.     PREVIOUS MEDICATIONS: Gabapentin, Lyrica (drowsy), clonazepam (drowsy); baclofen  ALLERGIES:  No Known Allergies  CURRENT MEDICATIONS:  Outpatient Encounter Medications as of 08/08/2024  Medication Sig   atorvastatin (LIPITOR) 20 MG tablet Take by mouth.   buPROPion  (WELLBUTRIN  SR) 150 MG 12 hr tablet Take by mouth.   Cholecalciferol 100 MCG (4000 UT) TABS Take by mouth.   escitalopram (LEXAPRO) 20 MG tablet Take 20 mg by mouth daily.   gabapentin (NEURONTIN) 100 MG capsule 1 po qHS   oxyCODONE-acetaminophen  (PERCOCET) 10-325 MG tablet Take 1 tablet by mouth every 4 (four) hours as needed.   pantoprazole (PROTONIX) 40 MG tablet Take 40 mg by mouth daily. (Patient taking differently: Take 40 mg by mouth continuous  as needed.)   phentermine (ADIPEX-P) 37.5 MG tablet Take 37.5 mg by mouth daily before breakfast.   RABEprazole (ACIPHEX) 20 MG tablet Take by mouth.   triamterene-hydrochlorothiazide (MAXZIDE) 75-50 MG tablet Take by mouth.   [DISCONTINUED] lurasidone  (LATUDA ) 20 MG TABS tablet Take 1 tablet (20 mg total) by mouth daily with supper. For depression (Patient not taking: Reported on 07/28/2019)   [DISCONTINUED] mirtazapine  (REMERON ) 45 MG tablet Take 1 tablet (45 mg total) by mouth at bedtime. (Patient not taking: Reported on 07/28/2019)   [DISCONTINUED] propranolol  (INDERAL ) 10 MG tablet Take 1 tablet (10 mg total) by mouth 2 (two) times daily as needed. For severe anxiety attacks   [DISCONTINUED] sertraline (ZOLOFT) 50 MG tablet Take 50 mg by mouth daily.   No facility-administered encounter medications on file as of 08/08/2024.    Objective:   PHYSICAL EXAMINATION:    VITALS:   Vitals:   08/08/24 0911  BP: 124/76  Pulse: 84  SpO2: 98%  Weight: 154 lb 6.4 oz (70 kg)    GEN:  Normal appears female in no acute distress.  Appears stated age.  She is somewhat anxious. HEENT:  Normocephalic, atraumatic. The mucous membranes are moist. The superficial temporal arteries are without ropiness or tenderness. Cardiovascular: Regular rate and rhythm. Lungs: Clear to auscultation bilaterally. Neck/Heme: There are no carotid bruits noted bilaterally.  NEUROLOGICAL: Orientation:  The patient is alert and oriented x 3.   Cranial nerves: There is good facial symmetry.  Nasolabial folds are symmetric.  Extraocular muscles are intact and visual fields are full to confrontational testing. Speech is fluent and clear. Soft palate rises symmetrically and there is no tongue deviation. Hearing is intact to conversational tone. Tone: Tone is good throughout. Sensation: Sensation is intact to light touch and pinprick throughout (facial, trunk, extremities). Vibration is intact at the bilateral big toe. There  is no extinction with double simultaneous stimulation. There is no sensory dermatomal level identified. Coordination:  The patient has no difficulty with RAM's or FNF bilaterally. Motor: Strength is 5/5 in the bilateral upper and lower extremities.  Shoulder shrug is equal and symmetric. There is no pronator drift.  There are no fasciculations noted. DTR's: Deep tendon reflexes are 3/4 at the bilateral biceps, triceps, brachioradialis, patella and achilles.  Plantar responses are downgoing bilaterally. Gait and Station: The patient is able to ambulate without difficulty. The patient is able to heel toe walk without any difficulty. The patient is able to ambulate in a tandem fashion. The patient is able to stand in the Romberg position. Abnormal movements: She has mild and rare spasm of the left zygomaticus.  She does have intermittent blepharospasm.  I did not see any spasm around orbicularis oris    Total time spent on today's visit was 60 minutes, including both face-to-face time and nonface-to-face time.  Time included that spent on review of records (prior notes available to me/labs/imaging if pertinent), discussing treatment and goals, answering patient's questions and coordinating care.   Cc:  Auston Reyes BIRCH, MD

## 2024-08-08 ENCOUNTER — Encounter: Payer: Self-pay | Admitting: Neurology

## 2024-08-08 ENCOUNTER — Ambulatory Visit: Admitting: Neurology

## 2024-08-08 ENCOUNTER — Telehealth: Payer: Self-pay

## 2024-08-08 VITALS — BP 124/76 | HR 84 | Wt 154.4 lb

## 2024-08-08 DIAGNOSIS — G245 Blepharospasm: Secondary | ICD-10-CM | POA: Diagnosis not present

## 2024-08-08 DIAGNOSIS — F411 Generalized anxiety disorder: Secondary | ICD-10-CM

## 2024-08-08 NOTE — Telephone Encounter (Signed)
 Pa sent for Xeomin 50 units

## 2024-08-08 NOTE — Telephone Encounter (Signed)
 Patient needs PA for Xeomin 50 units Blepharospasm [id number]

## 2024-08-08 NOTE — Patient Instructions (Signed)
 We discussed use of Xeomin (a type of botox product) and Botox.  We discussed counseling (cognitive behavioral therapy, biofeedback, etc) to help lower stress which can set off symptoms.  I would recommend this as we start to explore the botox treatments  The physicians and staff at [facility] are committed to providing excellent care. You may receive a survey requesting feedback about your experience at our office. We strive to receive very good responses to the survey questions. If you feel that your experience would prevent you from giving the office a very good  response, please contact our office to try to remedy the situation. We may be reached at [phone]. Thank you for taking the time out of your busy day to complete the survey.

## 2024-08-10 ENCOUNTER — Other Ambulatory Visit (HOSPITAL_COMMUNITY): Payer: Self-pay

## 2024-08-10 ENCOUNTER — Telehealth: Payer: Self-pay | Admitting: Pharmacy Technician

## 2024-08-10 NOTE — Telephone Encounter (Signed)
 Pharmacy Patient Advocate Encounter   Received notification from Pt Calls Messages that prior authorization for XEOMIN 50 is required/requested.   Insurance verification completed.   The patient is insured through [employer] [facility].   Per test claim: PA required; PA submitted to above mentioned insurance via Latent Key/confirmation #/EOC [id number] Status is pending  CPT code 35387 does not require a pre-cert Call reference # [id number]

## 2024-08-10 NOTE — Telephone Encounter (Signed)
 PA has been submitted, and telephone encounter has been created. Please see telephone encounter dated 11.19.25.

## 2024-08-11 NOTE — Telephone Encounter (Signed)
 Pharmacy Patient Advocate Encounter- Injection via Pharmacy Benefit:  PA was submitted  for Xeomin - [phone] to [facility] and has been approved through: 11.19.25 TO 11.19.26 Authorization# [id number]  Please send prescription to Specialty Pharmacy: [facility]: [phone]  Estimated Pharmacy Copay is: $90  Patient IS NOT eligible for Xeomin - [id number] Copay Card, which will make patient's copay as little as zero. Copay card will be provided to pharmacy.   Admin Code: [id number]   Does Not require Prior Auth.

## 2024-08-12 ENCOUNTER — Other Ambulatory Visit: Payer: Self-pay

## 2024-08-12 DIAGNOSIS — G245 Blepharospasm: Secondary | ICD-10-CM

## 2024-08-12 MED ORDER — XEOMIN 50 UNITS IM SOLR
50.0000 [IU] | Freq: Once | INTRAMUSCULAR | 3 refills | Status: AC
  Filled 2024-10-11: qty 1, 1d supply, fill #0

## 2024-08-16 ENCOUNTER — Other Ambulatory Visit (HOSPITAL_COMMUNITY): Payer: Self-pay

## 2024-08-16 ENCOUNTER — Ambulatory Visit: Admitting: Neurosurgery

## 2024-08-31 ENCOUNTER — Other Ambulatory Visit (HOSPITAL_COMMUNITY): Payer: Self-pay

## 2024-09-13 LAB — GENECONNECT MOLECULAR SCREEN: Genetic Analysis Overall Interpretation: NEGATIVE

## 2024-10-11 ENCOUNTER — Other Ambulatory Visit: Payer: Self-pay
